# Patient Record
Sex: Male | Born: 1945 | Race: White | Hispanic: No | Marital: Married | Smoking: Never smoker
Health system: Southern US, Community
[De-identification: ages and names within clinical notes are randomized; demographics above are authoritative.]

## PROBLEM LIST (undated history)

## (undated) DIAGNOSIS — I2699 Other pulmonary embolism without acute cor pulmonale: Secondary | ICD-10-CM

## (undated) DIAGNOSIS — Z9989 Dependence on other enabling machines and devices: Secondary | ICD-10-CM

## (undated) DIAGNOSIS — Z86718 Personal history of other venous thrombosis and embolism: Secondary | ICD-10-CM

---

## 2019-10-11 ENCOUNTER — Other Ambulatory Visit: Payer: Self-pay

## 2019-10-11 ENCOUNTER — Emergency Department (HOSPITAL_BASED_OUTPATIENT_CLINIC_OR_DEPARTMENT_OTHER): Payer: Medicare Other

## 2019-10-11 ENCOUNTER — Inpatient Hospital Stay (HOSPITAL_BASED_OUTPATIENT_CLINIC_OR_DEPARTMENT_OTHER)
Admission: EM | Admit: 2019-10-11 | Discharge: 2019-10-18 | DRG: 177 | Disposition: A | Discharge status: Home Health | Payer: Medicare Other | Attending: Internal Medicine | Admitting: Internal Medicine

## 2019-10-11 ENCOUNTER — Encounter (HOSPITAL_BASED_OUTPATIENT_CLINIC_OR_DEPARTMENT_OTHER): Payer: Self-pay

## 2019-10-11 DIAGNOSIS — Z86711 Personal history of pulmonary embolism: Secondary | ICD-10-CM | POA: Diagnosis not present

## 2019-10-11 DIAGNOSIS — Z66 Do not resuscitate: Secondary | ICD-10-CM | POA: Diagnosis present

## 2019-10-11 DIAGNOSIS — Z7952 Long term (current) use of systemic steroids: Secondary | ICD-10-CM

## 2019-10-11 DIAGNOSIS — Z888 Allergy status to other drugs, medicaments and biological substances status: Secondary | ICD-10-CM

## 2019-10-11 DIAGNOSIS — E785 Hyperlipidemia, unspecified: Secondary | ICD-10-CM | POA: Diagnosis present

## 2019-10-11 DIAGNOSIS — Z86718 Personal history of other venous thrombosis and embolism: Secondary | ICD-10-CM

## 2019-10-11 DIAGNOSIS — Z7982 Long term (current) use of aspirin: Secondary | ICD-10-CM

## 2019-10-11 DIAGNOSIS — Z7901 Long term (current) use of anticoagulants: Secondary | ICD-10-CM

## 2019-10-11 DIAGNOSIS — R0902 Hypoxemia: Secondary | ICD-10-CM

## 2019-10-11 DIAGNOSIS — J1282 Pneumonia due to coronavirus disease 2019: Secondary | ICD-10-CM | POA: Diagnosis present

## 2019-10-11 DIAGNOSIS — U071 COVID-19: Secondary | ICD-10-CM | POA: Diagnosis present

## 2019-10-11 DIAGNOSIS — Z79899 Other long term (current) drug therapy: Secondary | ICD-10-CM | POA: Diagnosis not present

## 2019-10-11 DIAGNOSIS — J9601 Acute respiratory failure with hypoxia: Secondary | ICD-10-CM | POA: Diagnosis present

## 2019-10-11 HISTORY — DX: Other pulmonary embolism without acute cor pulmonale: I26.99

## 2019-10-11 HISTORY — DX: Personal history of other venous thrombosis and embolism: Z86.718

## 2019-10-11 HISTORY — DX: Dependence on other enabling machines and devices: Z99.89

## 2019-10-11 LAB — RESPIRATORY PANEL BY RT PCR (FLU A&B, COVID)
Influenza A by PCR: NEGATIVE
Influenza B by PCR: NEGATIVE
SARS Coronavirus 2 by RT PCR: POSITIVE — AB

## 2019-10-11 LAB — CBC WITH DIFFERENTIAL/PLATELET
Abs Immature Granulocytes: 0.05 10*3/uL (ref 0.00–0.07)
Basophils Absolute: 0 10*3/uL (ref 0.0–0.1)
Basophils Relative: 0 %
Eosinophils Absolute: 0 10*3/uL (ref 0.0–0.5)
Eosinophils Relative: 0 %
HCT: 46.8 % (ref 39.0–52.0)
Hemoglobin: 15.9 g/dL (ref 13.0–17.0)
Immature Granulocytes: 1 %
Lymphocytes Relative: 9 %
Lymphs Abs: 0.8 10*3/uL (ref 0.7–4.0)
MCH: 30.7 pg (ref 26.0–34.0)
MCHC: 34 g/dL (ref 30.0–36.0)
MCV: 90.3 fL (ref 80.0–100.0)
Monocytes Absolute: 0.4 10*3/uL (ref 0.1–1.0)
Monocytes Relative: 5 %
Neutro Abs: 7.4 10*3/uL (ref 1.7–7.7)
Neutrophils Relative %: 85 %
Platelets: 399 10*3/uL (ref 150–400)
RBC: 5.18 MIL/uL (ref 4.22–5.81)
RDW: 13.2 % (ref 11.5–15.5)
WBC: 8.7 10*3/uL (ref 4.0–10.5)
nRBC: 0 % (ref 0.0–0.2)

## 2019-10-11 LAB — COMPREHENSIVE METABOLIC PANEL
ALT: 37 U/L (ref 0–44)
AST: 42 U/L — ABNORMAL HIGH (ref 15–41)
Albumin: 3.2 g/dL — ABNORMAL LOW (ref 3.5–5.0)
Alkaline Phosphatase: 63 U/L (ref 38–126)
Anion gap: 13 (ref 5–15)
BUN: 24 mg/dL — ABNORMAL HIGH (ref 8–23)
CO2: 24 mmol/L (ref 22–32)
Calcium: 8.9 mg/dL (ref 8.9–10.3)
Chloride: 102 mmol/L (ref 98–111)
Creatinine, Ser: 1.02 mg/dL (ref 0.61–1.24)
GFR calc Af Amer: >60 mL/min (ref >60)
GFR calc non Af Amer: >60 mL/min (ref >60)
Glucose, Bld: 140 mg/dL — ABNORMAL HIGH (ref 70–99)
Potassium: 3.9 mmol/L (ref 3.5–5.1)
Sodium: 139 mmol/L (ref 135–145)
Total Bilirubin: 0.9 mg/dL (ref 0.3–1.2)
Total Protein: 6.8 g/dL (ref 6.5–8.1)

## 2019-10-11 LAB — TROPONIN I (HIGH SENSITIVITY)
Troponin I (High Sensitivity): 9 ng/L (ref <18)
Troponin I (High Sensitivity): 7 ng/L (ref <18)

## 2019-10-11 LAB — SARS CORONAVIRUS 2 AG (30 MIN TAT): SARS Coronavirus 2 Ag: NEGATIVE

## 2019-10-11 LAB — D-DIMER, QUANTITATIVE: D-Dimer, Quant: 0.64 ug/mL-FEU — ABNORMAL HIGH (ref 0.00–0.50)

## 2019-10-11 LAB — LACTATE DEHYDROGENASE: LDH: 568 U/L — ABNORMAL HIGH (ref 98–192)

## 2019-10-11 LAB — LACTIC ACID, PLASMA
Lactic Acid, Venous: 3 mmol/L (ref 0.5–1.9)
Lactic Acid, Venous: 2.1 mmol/L (ref 0.5–1.9)

## 2019-10-11 LAB — PROCALCITONIN: Procalcitonin: <0.1 ng/mL

## 2019-10-11 LAB — FIBRINOGEN: Fibrinogen: >800 mg/dL — ABNORMAL HIGH (ref 210–475)

## 2019-10-11 LAB — C-REACTIVE PROTEIN: CRP: 16.5 mg/dL — ABNORMAL HIGH (ref <1.0)

## 2019-10-11 LAB — BRAIN NATRIURETIC PEPTIDE: B Natriuretic Peptide: 118.1 pg/mL — ABNORMAL HIGH (ref 0.0–100.0)

## 2019-10-11 LAB — FERRITIN: Ferritin: 765 ng/mL — ABNORMAL HIGH (ref 24–336)

## 2019-10-11 LAB — TRIGLYCERIDES: Triglycerides: 145 mg/dL (ref <150)

## 2019-10-11 MED ORDER — SODIUM CHLORIDE 0.9 % IV SOLN
100.0000 mg | Freq: Every day | INTRAVENOUS | Status: AC
  Administered 2019-10-12 – 2019-10-15 (×4): 100 mg via INTRAVENOUS
  Filled 2019-10-11 (×4): qty 20

## 2019-10-11 MED ORDER — SODIUM CHLORIDE 0.9 % IV SOLN
500.0000 mg | Freq: Once | INTRAVENOUS | Status: AC
  Administered 2019-10-11: 500 mg via INTRAVENOUS
  Filled 2019-10-11: qty 500

## 2019-10-11 MED ORDER — SODIUM CHLORIDE 0.9 % IV SOLN
INTRAVENOUS | Status: DC | PRN
  Administered 2019-10-11: 250 mL via INTRAVENOUS

## 2019-10-11 MED ORDER — DEXAMETHASONE SODIUM PHOSPHATE 10 MG/ML IJ SOLN
10.0000 mg | Freq: Once | INTRAMUSCULAR | Status: AC
  Administered 2019-10-11: 10 mg via INTRAVENOUS
  Filled 2019-10-11: qty 1

## 2019-10-11 MED ORDER — ALBUTEROL SULFATE HFA 108 (90 BASE) MCG/ACT IN AERS
4.0000 | INHALATION_SPRAY | RESPIRATORY_TRACT | Status: AC
  Administered 2019-10-11: 4 via RESPIRATORY_TRACT
  Filled 2019-10-11: qty 6.7

## 2019-10-11 MED ORDER — SODIUM CHLORIDE 0.9 % IV SOLN
100.0000 mg | INTRAVENOUS | Status: AC
  Administered 2019-10-11 (×2): 100 mg via INTRAVENOUS
  Filled 2019-10-11 (×2): qty 20

## 2019-10-11 MED ORDER — SODIUM CHLORIDE 0.9 % IV SOLN
1.0000 g | Freq: Once | INTRAVENOUS | Status: AC
  Administered 2019-10-11: 1 g via INTRAVENOUS
  Filled 2019-10-11: qty 10

## 2019-10-11 MED ORDER — SODIUM CHLORIDE 0.9 % IV BOLUS
250.0000 mL | Freq: Once | INTRAVENOUS | Status: AC
  Administered 2019-10-11: 250 mL via INTRAVENOUS

## 2019-10-11 NOTE — ED Triage Notes (Signed)
Pt arrives to ED POV with c/o SOB starting today. Pt states that he was diagnosed with bronchitis last Monday.

## 2019-10-11 NOTE — ED Provider Notes (Signed)
[facility] EMERGENCY DEPARTMENT Provider Note   CSN: [id number] Arrival date & time: 10/11/19  1732     History Chief Complaint  Patient presents with  . Shortness of Breath    Bruce Stanley is a 74 y.o. male.  HPI Patient reports he was diagnosed with sinusitis 1/5.  He was treated with Flonase, doxycycline and Tessalon.  Earlier in the week he had developed a brief period of fever up to 101 but he reports that it resolved by the time he rechecked it in the evening.  He was having some coughing which he qualifies as better now.  Patient was seen at urgent care on 1\11 and had negative rapid Covid and a chest x-ray that did not show acute findings.  He was placed on oral prednisone and cough syrup with prescription for ProAir .  He reports he became much more short of breath today.  He reports he has had fatigue.  He denies any chest pain.  He denies swelling of his legs or calves.  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  Patient denies any history of COPD.  He reports he is supposed to wear nighttime CPAP but does not use oxygen chronically.  Denies any history of CHF.    Past Medical History:  Diagnosis Date  . CPAP (continuous positive airway pressure) dependence   . H/O blood clots   . PE (pulmonary thromboembolism) [facility])     Patient Active Problem List   Diagnosis Date Noted  . Pneumonia due to COVID-19 virus 10/11/2019         No family history on file.  Social History   Tobacco Use  . Smoking status: Never Smoker  . Smokeless tobacco: Never Used  Substance Use Topics  . Alcohol use: Never  . Drug use: Never    Home Medications Prior to Admission medications   Medication Sig Start Date End Date Taking? Authorizing Provider  albuterol (VENTOLIN HFA) 108 (90 Base) MCG/ACT inhaler Inhale into the lungs. 10/07/19 11/06/19 Yes [provider]  aspirin 81 MG EC tablet Take by mouth. 11/07/13  Yes  [provider]  atorvastatin (LIPITOR) 40 MG tablet TAKE 1 TABLET(40 MG) BY MOUTH DAILY 01/16/17  Yes [provider]  doxycycline (VIBRA-TABS) 100 MG tablet Take by mouth. 10/04/19 10/14/19 Yes [provider]  fluticasone (FLONASE) 50 MCG/ACT nasal spray 2 sprays by Each Nare route daily. 10/01/19  Yes [provider]  guaiFENesin-codeine (ROBITUSSIN AC) 100-10 MG/5ML syrup Take by mouth. 10/07/19 10/17/19 Yes [provider]  Omega-3 Fatty Acids (FISH OIL) 1000 MG CAPS TAKE 1 CAPSULE BY MOUTH DAILY 08/16/16  Yes [provider]  predniSONE (DELTASONE) 5 MG tablet Take by mouth. 10/07/19 10/13/19 Yes [provider]  warfarin (COUMADIN) 5 MG tablet Take by mouth. 01/17/17 12/09/19 Yes [provider]  benzonatate (TESSALON) 200 MG capsule Take by mouth. 10/04/19 10/11/19  [provider]    Allergies    Iodine  Review of Systems   Review of Systems 10 Systems reviewed and are negative for acute change except as noted in the HPI.  Physical Exam Updated Vital Signs BP (!) 147/99   Pulse (!) 115   Temp 98.3 F (36.8 C) (Oral)   Resp (!) 22   Ht 6' (1.829 m)   Wt 117.9 kg   SpO2 91%   BMI 35.26 kg/m   Physical Exam Constitutional:  Comments: Patient is alert.  He is moderately dyspneic with tachypnea.  Mental status is clear.  HENT:     Head: Normocephalic and atraumatic.  Eyes:     Extraocular Movements: Extraocular movements intact.  Cardiovascular:     Comments: Tachycardia no gross rub murmur gallop. Pulmonary:     Comments: Moderate increased work of breathing at rest.  Crackles bilateral lower lung fields. Abdominal:     General: There is no distension.     Palpations: Abdomen is soft.     Tenderness: There is no abdominal tenderness. There is no guarding.  Musculoskeletal:        General: No swelling or tenderness. Normal range of motion.     Cervical back: Neck supple.     Right lower leg: No  edema.     Left lower leg: No edema.  Skin:    General: Skin is warm and dry.  Neurological:     General: No focal deficit present.     Mental Status: He is oriented to person, place, and time.     Coordination: Coordination normal.  Psychiatric:        Mood and Affect: Mood normal.     ED Results / Procedures / Treatments   Labs (all labs ordered are listed, but only abnormal results are displayed) Labs Reviewed  RESPIRATORY PANEL BY RT PCR (FLU A&B, COVID) - Abnormal; Notable for the following components:      Result Value   SARS Coronavirus 2 by RT PCR POSITIVE (*)    All other components within normal limits  LACTIC ACID, PLASMA - Abnormal; Notable for the following components:   Lactic Acid, Venous 3.0 (*)    All other components within normal limits  LACTIC ACID, PLASMA - Abnormal; Notable for the following components:   Lactic Acid, Venous 2.1 (*)    All other components within normal limits  COMPREHENSIVE METABOLIC PANEL - Abnormal; Notable for the following components:   Glucose, Bld 140 (*)    BUN 24 (*)    Albumin 3.2 (*)    AST 42 (*)    All other components within normal limits  D-DIMER, QUANTITATIVE (NOT AT [facility]) - Abnormal; Notable for the following components:   D-Dimer, Quant 0.64 (*)    All other components within normal limits  LACTATE DEHYDROGENASE - Abnormal; Notable for the following components:   LDH 568 (*)    All other components within normal limits  FERRITIN - Abnormal; Notable for the following components:   Ferritin 765 (*)    All other components within normal limits  FIBRINOGEN - Abnormal; Notable for the following components:   Fibrinogen >800 (*)    All other components within normal limits  C-REACTIVE PROTEIN - Abnormal; Notable for the following components:   CRP 16.5 (*)    All other components within normal limits  BRAIN NATRIURETIC PEPTIDE - Abnormal; Notable for the following components:   B Natriuretic Peptide 118.1 (*)    All  other components within normal limits  SARS CORONAVIRUS 2 AG (30 MIN TAT)  CULTURE, BLOOD (ROUTINE X 2)  CULTURE, BLOOD (ROUTINE X 2)  SARS CORONAVIRUS 2 BY RT PCR (HOSPITAL ORDER, PERFORMED IN [facility])  CBC WITH DIFFERENTIAL/PLATELET  PROCALCITONIN  TRIGLYCERIDES  TROPONIN I (HIGH SENSITIVITY)  TROPONIN I (HIGH SENSITIVITY)    EKG EKG Interpretation  Date/Time:  Friday October 11 2019 17:39:52 EST Ventricular Rate:  117 PR Interval:    QRS Duration: 98 QT  Interval:  329 QTC Calculation: 459 R Axis:   -158 Text Interpretation: Sinus tachycardia Atrial premature complexes Low voltage, precordial leads Consider right ventricular hypertrophy Baseline wander in lead(s) V3 no acute ischemic changes Confirmed by Charlesetta Shanks [phone]) on 10/11/2019 6:02:28 PM   Radiology DG Chest Port 1 View  Result Date: 10/11/2019 CLINICAL DATA:  Shortness of breath EXAM: PORTABLE CHEST 1 VIEW COMPARISON:  10/07/2019 FINDINGS: Interim development of bilateral left greater than right interstitial and ground-glass opacity, largely peripheral on the left. No pleural effusion. Mild cardiomegaly. No pneumothorax. IMPRESSION: Interim development of left greater than right interstitial and ground-glass opacity, suspicious for bilateral pneumonia, possible atypical or viral pneumonia. Electronically Signed   By: Donavan Foil M.D.   On: 10/11/2019 18:22    Procedures Procedures (including critical care time) CRITICAL CARE Performed by: Charlesetta Shanks   Total critical care time: 45 minutes  Critical care time was exclusive of separately billable procedures and treating other patients.  Critical care was necessary to treat or prevent imminent or life-threatening deterioration.  Critical care was time spent personally by me on the following activities: development of treatment plan with patient and/or surrogate as well as nursing, discussions with consultants, evaluation of patient's  response to treatment, examination of patient, obtaining history from patient or surrogate, ordering and performing treatments and interventions, ordering and review of laboratory studies, ordering and review of radiographic studies, pulse oximetry and re-evaluation of patient's condition. Medications Ordered in ED Medications  albuterol (VENTOLIN HFA) 108 (90 Base) MCG/ACT inhaler 4 puff (4 puffs Inhalation Given by Other 10/11/19 1918)  0.9 %  sodium chloride infusion (250 mLs Intravenous New Bag/Given 10/11/19 1925)  0.9 %  sodium chloride infusion ( Intravenous Stopped 10/11/19 2004)  remdesivir 100 mg in sodium chloride 0.9 % 100 mL IVPB (100 mg Intravenous New Bag/Given 10/11/19 2233)    Followed by  remdesivir 100 mg in sodium chloride 0.9 % 100 mL IVPB (has no administration in time range)  cefTRIAXone (ROCEPHIN) 1 g in sodium chloride 0.9 % 100 mL IVPB (0 g Intravenous Stopped 10/11/19 2004)  azithromycin (ZITHROMAX) 500 mg in sodium chloride 0.9 % 250 mL IVPB (0 mg Intravenous Stopped 10/11/19 2100)  sodium chloride 0.9 % bolus 250 mL (0 mLs Intravenous Stopped 10/11/19 1930)  dexamethasone (DECADRON) injection 10 mg (10 mg Intravenous Given 10/11/19 2225)    ED Course  I have reviewed the triage vital signs and the nursing notes.  Pertinent labs & imaging results that were available during my care of the patient were reviewed by me and considered in my medical decision making (see chart for details).    MDM Rules/Calculators/A&P                     Consult: Reviewed Dr. Andria Frames at [facility] for admission.  Patient presents as outlined above.  Presentation is consistent with Covid pneumonia.  This is confirmed on PCR testing and chest x-ray.  Patient arrived hypoxic in the mid 59s.  On 6 to 8 L supplemental oxygen patient is maintaining saturations in the low 90s.  He is much more comfortable at this point.  His heart rate has improved somewhat and still in the low 100s.   Patient did have elevated lactic acid and this chest x-ray that had some suggestion of consolidation, prior to receiving Covid results I did empirically initiate a dose of Rocephin and Zithromax.  Patient is also been given albuterol inhaler and Decadron.  His mental status is clear.  Patient is tachypneic but reports feeling improved and has now lately been requesting something to eat.  We will continue to monitor closely with plan to transfer to [facility] for admission for COVID-19 pneumonia with hypoxia. Final Clinical Impression(s) / ED Diagnoses Final diagnoses:  Pneumonia due to COVID-19 virus  Hypoxia    Rx / DC Orders ED Discharge Orders    None       Arby Barrette, MD 10/11/19 2359

## 2019-10-11 NOTE — ED Notes (Signed)
  Turned patients O2 down from 7L to 6L.  Shortly after, the patient had a coughing episode and SPO2 dropped down to 85-86%.  After coughing episode patients SPO2 was not improving.  Patient was increased to 10L and instructed to take deep breaths through his nose and out his mouth.  Patient slowly came back to 92% and O2 was turned back down to 7L.  Patient maintaining SPO2 of 90-92%.  RT Notified

## 2019-10-11 NOTE — ED Notes (Signed)
Spoke with Roe Coombs (friend) states he will call wife and inform her of pts condition, message left for daughter daughter returned call informed of pts condition. Pt aware

## 2019-10-11 NOTE — ED Notes (Signed)
Pt daughter number added to chart.

## 2019-10-11 NOTE — ED Notes (Signed)
  Patient was given water and graham crackers.

## 2019-10-12 ENCOUNTER — Encounter (HOSPITAL_COMMUNITY): Payer: Self-pay

## 2019-10-12 DIAGNOSIS — R0902 Hypoxemia: Secondary | ICD-10-CM

## 2019-10-12 DIAGNOSIS — J1282 Pneumonia due to coronavirus disease 2019: Secondary | ICD-10-CM

## 2019-10-12 DIAGNOSIS — Z79899 Other long term (current) drug therapy: Secondary | ICD-10-CM

## 2019-10-12 DIAGNOSIS — E785 Hyperlipidemia, unspecified: Secondary | ICD-10-CM

## 2019-10-12 DIAGNOSIS — Z86718 Personal history of other venous thrombosis and embolism: Secondary | ICD-10-CM

## 2019-10-12 DIAGNOSIS — J9601 Acute respiratory failure with hypoxia: Secondary | ICD-10-CM

## 2019-10-12 DIAGNOSIS — Z7901 Long term (current) use of anticoagulants: Secondary | ICD-10-CM

## 2019-10-12 DIAGNOSIS — Z86711 Personal history of pulmonary embolism: Secondary | ICD-10-CM

## 2019-10-12 DIAGNOSIS — U071 COVID-19: Principal | ICD-10-CM

## 2019-10-12 DIAGNOSIS — G4733 Obstructive sleep apnea (adult) (pediatric): Secondary | ICD-10-CM

## 2019-10-12 DIAGNOSIS — Z66 Do not resuscitate: Secondary | ICD-10-CM

## 2019-10-12 LAB — CBC WITH DIFFERENTIAL/PLATELET
Abs Immature Granulocytes: 0.03 10*3/uL (ref 0.00–0.07)
Basophils Absolute: 0 10*3/uL (ref 0.0–0.1)
Basophils Relative: 0 %
Eosinophils Absolute: 0 10*3/uL (ref 0.0–0.5)
Eosinophils Relative: 0 %
HCT: 45.7 % (ref 39.0–52.0)
Hemoglobin: 15.3 g/dL (ref 13.0–17.0)
Immature Granulocytes: 1 %
Lymphocytes Relative: 9 %
Lymphs Abs: 0.5 10*3/uL — ABNORMAL LOW (ref 0.7–4.0)
MCH: 30.1 pg (ref 26.0–34.0)
MCHC: 33.5 g/dL (ref 30.0–36.0)
MCV: 90 fL (ref 80.0–100.0)
Monocytes Absolute: 0.1 10*3/uL (ref 0.1–1.0)
Monocytes Relative: 2 %
Neutro Abs: 5.3 10*3/uL (ref 1.7–7.7)
Neutrophils Relative %: 88 %
Platelets: 376 10*3/uL (ref 150–400)
RBC: 5.08 MIL/uL (ref 4.22–5.81)
RDW: 13.3 % (ref 11.5–15.5)
WBC: 6 10*3/uL (ref 4.0–10.5)
nRBC: 0 % (ref 0.0–0.2)

## 2019-10-12 LAB — COMPREHENSIVE METABOLIC PANEL
ALT: 39 U/L (ref 0–44)
AST: 40 U/L (ref 15–41)
Albumin: 3.2 g/dL — ABNORMAL LOW (ref 3.5–5.0)
Alkaline Phosphatase: 63 U/L (ref 38–126)
Anion gap: 13 (ref 5–15)
BUN: 27 mg/dL — ABNORMAL HIGH (ref 8–23)
CO2: 25 mmol/L (ref 22–32)
Calcium: 9.1 mg/dL (ref 8.9–10.3)
Chloride: 103 mmol/L (ref 98–111)
Creatinine, Ser: 1 mg/dL (ref 0.61–1.24)
GFR calc Af Amer: >60 mL/min (ref >60)
GFR calc non Af Amer: >60 mL/min (ref >60)
Glucose, Bld: 147 mg/dL — ABNORMAL HIGH (ref 70–99)
Potassium: 4 mmol/L (ref 3.5–5.1)
Sodium: 141 mmol/L (ref 135–145)
Total Bilirubin: 0.6 mg/dL (ref 0.3–1.2)
Total Protein: 7.6 g/dL (ref 6.5–8.1)

## 2019-10-12 LAB — GLUCOSE, CAPILLARY
Glucose-Capillary: 146 mg/dL — ABNORMAL HIGH (ref 70–99)
Glucose-Capillary: 186 mg/dL — ABNORMAL HIGH (ref 70–99)
Glucose-Capillary: 135 mg/dL — ABNORMAL HIGH (ref 70–99)
Glucose-Capillary: 141 mg/dL — ABNORMAL HIGH (ref 70–99)

## 2019-10-12 LAB — HEMOGLOBIN A1C
Hgb A1c MFr Bld: 5.7 % — ABNORMAL HIGH (ref 4.8–5.6)
Mean Plasma Glucose: 116.89 mg/dL

## 2019-10-12 LAB — C-REACTIVE PROTEIN: CRP: 14.8 mg/dL — ABNORMAL HIGH (ref <1.0)

## 2019-10-12 LAB — D-DIMER, QUANTITATIVE: D-Dimer, Quant: 0.94 ug/mL-FEU — ABNORMAL HIGH (ref 0.00–0.50)

## 2019-10-12 LAB — PROTIME-INR
INR: 4.2 (ref 0.8–1.2)
Prothrombin Time: 40.6 seconds — ABNORMAL HIGH (ref 11.4–15.2)

## 2019-10-12 LAB — SARS CORONAVIRUS 2 BY RT PCR (HOSPITAL ORDER, PERFORMED IN ~~LOC~~ HOSPITAL LAB): SARS Coronavirus 2: POSITIVE — AB

## 2019-10-12 LAB — ABO/RH: ABO/RH(D): O POS

## 2019-10-12 LAB — FERRITIN: Ferritin: 898 ng/mL — ABNORMAL HIGH (ref 24–336)

## 2019-10-12 MED ORDER — DEXAMETHASONE SODIUM PHOSPHATE 10 MG/ML IJ SOLN: 6.0000 mg | INTRAMUSCULAR | Status: DC

## 2019-10-12 MED ORDER — SODIUM CHLORIDE 0.9 % IV SOLN: 250.0000 mL | INTRAVENOUS | Status: DC | PRN

## 2019-10-12 MED ORDER — BENZONATATE 100 MG PO CAPS: 100.0000 mg | ORAL_CAPSULE | ORAL | Status: DC | PRN

## 2019-10-12 MED ORDER — GUAIFENESIN-DM 100-10 MG/5ML PO SYRP
10.0000 mL | ORAL_SOLUTION | ORAL | Status: DC | PRN
  Administered 2019-10-12 – 2019-10-17 (×2): 10 mL via ORAL
  Filled 2019-10-12 (×2): qty 10

## 2019-10-12 MED ORDER — ENOXAPARIN SODIUM 40 MG/0.4ML ~~LOC~~ SOLN: 40.0000 mg | SUBCUTANEOUS | Status: DC

## 2019-10-12 MED ORDER — OMEGA-3-ACID ETHYL ESTERS 1 G PO CAPS
1.0000 g | ORAL_CAPSULE | Freq: Every day | ORAL | Status: DC
  Administered 2019-10-12 – 2019-10-18 (×7): 1 g via ORAL
  Filled 2019-10-12 (×8): qty 1

## 2019-10-12 MED ORDER — TOCILIZUMAB 400 MG/20ML IV SOLN
800.0000 mg | Freq: Once | INTRAVENOUS | Status: AC
  Administered 2019-10-12: 800 mg via INTRAVENOUS
  Filled 2019-10-12: qty 40

## 2019-10-12 MED ORDER — IPRATROPIUM-ALBUTEROL 20-100 MCG/ACT IN AERS
1.0000 | INHALATION_SPRAY | Freq: Four times a day (QID) | RESPIRATORY_TRACT | Status: DC
  Administered 2019-10-12 – 2019-10-18 (×25): 1 via RESPIRATORY_TRACT
  Filled 2019-10-12: qty 4

## 2019-10-12 MED ORDER — INSULIN ASPART 100 UNIT/ML ~~LOC~~ SOLN
0.0000 [IU] | Freq: Three times a day (TID) | SUBCUTANEOUS | Status: DC
  Administered 2019-10-12: 2 [IU] via SUBCUTANEOUS
  Administered 2019-10-12 – 2019-10-13 (×3): 1 [IU] via SUBCUTANEOUS
  Administered 2019-10-13: 2 [IU] via SUBCUTANEOUS
  Administered 2019-10-14: 3 [IU] via SUBCUTANEOUS
  Administered 2019-10-14: 1 [IU] via SUBCUTANEOUS
  Administered 2019-10-15: 2 [IU] via SUBCUTANEOUS
  Administered 2019-10-15: 1 [IU] via SUBCUTANEOUS
  Administered 2019-10-15: 3 [IU] via SUBCUTANEOUS
  Administered 2019-10-16 (×2): 2 [IU] via SUBCUTANEOUS
  Administered 2019-10-18 (×3): 1 [IU] via SUBCUTANEOUS

## 2019-10-12 MED ORDER — ACETAMINOPHEN 325 MG PO TABS: 650.0000 mg | ORAL_TABLET | Freq: Four times a day (QID) | ORAL | Status: DC | PRN

## 2019-10-12 MED ORDER — ASCORBIC ACID 500 MG PO TABS
500.0000 mg | ORAL_TABLET | Freq: Every day | ORAL | Status: DC
  Administered 2019-10-12 – 2019-10-18 (×7): 500 mg via ORAL
  Filled 2019-10-12 (×7): qty 1

## 2019-10-12 MED ORDER — FAMOTIDINE 20 MG PO TABS
20.0000 mg | ORAL_TABLET | Freq: Every day | ORAL | Status: DC
  Administered 2019-10-12 – 2019-10-18 (×7): 20 mg via ORAL
  Filled 2019-10-12 (×7): qty 1

## 2019-10-12 MED ORDER — SODIUM CHLORIDE 0.9% FLUSH: 3.0000 mL | INTRAVENOUS | Status: DC | PRN

## 2019-10-12 MED ORDER — BENZONATATE 100 MG PO CAPS: 100.0000 mg | ORAL_CAPSULE | Freq: Three times a day (TID) | ORAL | Status: DC | PRN

## 2019-10-12 MED ORDER — SODIUM CHLORIDE 0.9% FLUSH: 3.0000 mL | Freq: Two times a day (BID) | INTRAVENOUS | Status: DC

## 2019-10-12 MED ORDER — GUAIFENESIN-CODEINE 100-10 MG/5ML PO SOLN: 5.0000 mL | Freq: Four times a day (QID) | ORAL | Status: DC | PRN

## 2019-10-12 MED ORDER — ENOXAPARIN SODIUM 60 MG/0.6ML ~~LOC~~ SOLN: 60.0000 mg | SUBCUTANEOUS | Status: DC

## 2019-10-12 MED ORDER — ALBUTEROL SULFATE HFA 108 (90 BASE) MCG/ACT IN AERS
1.0000 | INHALATION_SPRAY | Freq: Four times a day (QID) | RESPIRATORY_TRACT | Status: DC | PRN
  Administered 2019-10-12: 2 via RESPIRATORY_TRACT
  Filled 2019-10-12: qty 6.7

## 2019-10-12 MED ORDER — PREDNISONE 10 MG PO TABS: 5.0000 mg | ORAL_TABLET | Freq: Every day | ORAL | Status: DC

## 2019-10-12 MED ORDER — FLUTICASONE PROPIONATE 50 MCG/ACT NA SUSP
2.0000 | Freq: Every day | NASAL | Status: DC
  Administered 2019-10-12 – 2019-10-18 (×7): 2 via NASAL
  Filled 2019-10-12: qty 16

## 2019-10-12 MED ORDER — HYDROCOD POLST-CPM POLST ER 10-8 MG/5ML PO SUER
5.0000 mL | Freq: Two times a day (BID) | ORAL | Status: DC | PRN
  Administered 2019-10-14 – 2019-10-18 (×3): 5 mL via ORAL
  Filled 2019-10-12 (×3): qty 5

## 2019-10-12 MED ORDER — ZINC SULFATE 220 (50 ZN) MG PO CAPS
220.0000 mg | ORAL_CAPSULE | Freq: Every day | ORAL | Status: DC
  Administered 2019-10-12 – 2019-10-18 (×7): 220 mg via ORAL
  Filled 2019-10-12 (×7): qty 1

## 2019-10-12 MED ORDER — DEXAMETHASONE SODIUM PHOSPHATE 10 MG/ML IJ SOLN
10.0000 mg | INTRAMUSCULAR | Status: DC
  Administered 2019-10-12 – 2019-10-17 (×6): 10 mg via INTRAVENOUS
  Filled 2019-10-12 (×5): qty 1

## 2019-10-12 MED ORDER — ATORVASTATIN CALCIUM 40 MG PO TABS
40.0000 mg | ORAL_TABLET | Freq: Every day | ORAL | Status: DC
  Administered 2019-10-12 – 2019-10-18 (×7): 40 mg via ORAL
  Filled 2019-10-12 (×7): qty 1

## 2019-10-12 MED ORDER — ASPIRIN 81 MG PO TBEC: 81.0000 mg | DELAYED_RELEASE_TABLET | Freq: Every day | ORAL | Status: DC

## 2019-10-12 MED ORDER — WARFARIN - PHARMACIST DOSING INPATIENT: Freq: Every day | Status: DC

## 2019-10-12 NOTE — Plan of Care (Signed)
  Problem: Education: Goal: Knowledge of risk factors and measures for prevention of condition will improve Outcome: Progressing   Problem: Coping: Goal: Psychosocial and spiritual needs will be supported Outcome: Progressing   Problem: Respiratory: Goal: Will maintain a patent airway Outcome: Progressing Goal: Complications related to the disease process, condition or treatment will be avoided or minimized Outcome: Progressing   

## 2019-10-12 NOTE — Progress Notes (Signed)
PROGRESS NOTE                                                                                                                                                                                                             Bruce Stanley Demographics:    Bruce Stanley, is a 74 y.o. male, DOB - June 21, 1946, WUJ:[id number]  Outpatient Primary MD for the Bruce Stanley is Bruce Stanley, No Pcp Per   Admit date - 10/11/2019   LOS - 1  Chief Complaint  Bruce Stanley presents with  . Shortness of Breath       Brief Narrative: Bruce Stanley is a 74 y.o. 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  See below for further details.   Subjective:    [name] today claims to feel slightly better-but still on 7 L of oxygen.  He does not appear to be any distress.  Denies any nausea vomiting or diarrhea.   Assessment  & Plan :   Acute Hypoxic Resp Failure due to Covid 19 Viral pneumonia: Seems to be essentially the same-still requiring 7 L of oxygen.  Continue steroids  and remdesivir.  Extensive discussion with Bruce Stanley regarding off label use of Actemra-rationale, risks, benefits discussed-he consents.  He has no history of TB, hep B, diverticulitis.  The rationale for the off label use of Actemra its known side effects, potential benefits was  discussed with Bruce Stanley .The use of Actemra is based on published clinical articles/anecdotal data as several randomized trials have been negative, but lately there have been a few positive randomized trials as well.  There is no history of tuberculosis, hepatitis B or C.  Complete risks and long-term side effects are unknown, however in the best clinical judgment it is felt that the clinical benefit at this time  outweighs medical risks given tenuous clinical state of the Bruce Stanley.  Bruce Stanley agrees with the treatment plan and consent to the use of Actemra.    Fever: afebrile  O2 requirements:  SpO2: 97 % O2 Flow Rate (L/min): 7 L/min   COVID-19 Labs: Recent Labs    10/11/19 1745  DDIMER 0.64*  FERRITIN 765*  LDH 568*  CRP 16.5*  Component Value Date/Time   BNP 118.1 (H) 10/11/2019 1745    Recent Labs  Lab 10/11/19 1745  PROCALCITON <0.10    Lab Results  Component Value Date   SARSCOV2NAA POSITIVE (A) 10/11/2019     COVID-19 Medications: Steroids: 1/15>> Remdesivir: 1/15>>  Other medications: Diuretics:Euvolemic-no need for lasix Antibiotics:Not needed as no evidence of bacterial infection Insulin: Monitor CBGs closely on SSI while on steroids.  Check A1c.   Prone/Incentive Spirometry: encouraged Bruce Stanley to lie prone for 3-4 hours at a time for a total of 16 hours a day, and to encourage incentive spirometry use 3-4/hour.  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 Yes; Fluid consistency: Thin  Diet effective now               Disposition Plan  :  Remain hospitalized  Barriers to discharge: Hypoxia requiring O2 supplementation/complete 5 days of IV Remdesivir  Antimicorbials  :    Anti-infectives (From admission, onward)   Start     Dose/Rate Route Frequency Ordered Stop   10/12/19 1000  remdesivir 100 mg in sodium chloride 0.9 % 100 mL IVPB     100 mg 200 mL/hr over 30  Minutes Intravenous Daily 10/11/19 2209 10/16/19 0959   10/11/19 2215  remdesivir 100 mg in sodium chloride 0.9 % 100 mL IVPB     100 mg 200 mL/hr over 30 Minutes Intravenous Every 30 min 10/11/19 2209 10/12/19 0010   10/11/19 1900  cefTRIAXone (ROCEPHIN) 1 g in sodium chloride 0.9 % 100 mL IVPB     1 g 200 mL/hr over 30 Minutes Intravenous  Once 10/11/19 1857 10/11/19 2004   10/11/19 1900  azithromycin (ZITHROMAX) 500 mg in sodium chloride 0.9 % 250 mL IVPB     500 mg 250 mL/hr over 60 Minutes Intravenous  Once 10/11/19 1857 10/11/19 2100      Inpatient Medications  Scheduled Meds: . vitamin C  500 mg Oral Daily  . atorvastatin  40 mg Oral q1800  . dexamethasone (DECADRON) injection  6 mg Intravenous Q24H  . fluticasone  2 spray Each Nare Daily  . Ipratropium-Albuterol  1 puff Inhalation Q6H  . omega-3 acid ethyl esters  1 g Oral Daily  . Warfarin - Pharmacist Dosing Inpatient   Does not apply q1800  . zinc sulfate  220 mg Oral Daily   Continuous Infusions: . sodium chloride Stopped (10/11/19 2004)  . remdesivir 100 mg in NS 100 mL     PRN Meds:.sodium chloride, acetaminophen, albuterol, benzonatate, chlorpheniramine-HYDROcodone, guaiFENesin-dextromethorphan   Time Spent in minutes 35  See all Orders from today for further details   Oren Binet M.D on 10/12/2019 at 7:42 AM  To page go to [email or web address] - use universal password  [facility] -  Office  [phone]    Objective:   Vitals:   10/12/19 0000 10/12/19 0030 10/12/19 0158 10/12/19 0522  BP: (!) 152/95 (!) 158/95 (!) 151/91 116/66  Pulse: 92 89 92 78  Resp: (!) 24 (!) 27 (!) 27 (!) 21  Temp:      TempSrc:      SpO2: 92% 95% 91% 97%  Weight:      Height:        Wt Readings from Last 3 Encounters:  10/11/19 117.9 kg     Intake/Output Summary (Last 24 hours) at 10/12/2019 0742 Last data filed at 10/12/2019 0200 Gross per 24 hour  Intake 0 ml  Output 100 ml  Net -100 ml     Physical  Exam Gen Exam:Alert awake-not in any distress HEENT:atraumatic, normocephalic Chest: B/L clear to auscultation anteriorly CVS:S1S2 regular Abdomen:soft non tender, non distended Extremities:no edema Neurology: Non focal Skin: no rash   Data Review:    CBC Recent Labs  Lab 10/11/19 1745  WBC 8.7  HGB 15.9  HCT 46.8  PLT 399  MCV 90.3  MCH 30.7  MCHC 34.0  RDW 13.2  LYMPHSABS 0.8  MONOABS 0.4  EOSABS 0.0  BASOSABS 0.0    Chemistries  Recent Labs  Lab 10/11/19 1745  NA 139  K 3.9  CL 102  CO2 24  GLUCOSE 140*  BUN 24*  CREATININE 1.02  CALCIUM 8.9  AST 42*  ALT 37  ALKPHOS 63  BILITOT 0.9   ------------------------------------------------------------------------------------------------------------------ Recent Labs    10/11/19 1745  TRIG 145    No results found for: HGBA1C ------------------------------------------------------------------------------------------------------------------ No results for input(s): TSH, T4TOTAL, T3FREE, THYROIDAB in the last 72 hours.  Invalid input(s): FREET3 ------------------------------------------------------------------------------------------------------------------ Recent Labs    10/11/19 1745  FERRITIN 765*    Coagulation profile Recent Labs  Lab 10/12/19 0423  INR 4.2*    Recent Labs    10/11/19 1745  DDIMER 0.64*    Cardiac Enzymes No results for input(s): CKMB, TROPONINI, MYOGLOBIN in the last 168 hours.  Invalid input(s): CK ------------------------------------------------------------------------------------------------------------------    Component Value Date/Time   BNP 118.1 (H) 10/11/2019 1745    Micro Results Recent Results (from the past 240 hour(s))  Blood Culture (routine x 2)     Status: None (Preliminary result)   Collection Time: 10/11/19  5:45 PM   Specimen: BLOOD RIGHT ARM  Result Value Ref Range Status   Specimen Description   Final    BLOOD RIGHT ARM Performed at [facility], [address] [place], [place] [address]    Special Requests   Final    BOTTLES DRAWN AEROBIC AND ANAEROBIC Blood Culture adequate volume Performed at [facility], [address] [place], [place] [address]    Culture   Final    NO GROWTH < 12 HOURS Performed at [facility], [place] [address], [place], [place] [address]    Report Status PENDING  Incomplete  SARS Coronavirus 2 Ag (30 min TAT) - Nasal Swab (BD Veritor Kit)     Status: None   Collection Time: 10/11/19  5:45 PM   Specimen: Nasal Swab (BD Veritor Kit)  Result Value Ref Range Status   SARS Coronavirus 2 Ag NEGATIVE NEGATIVE Final    Comment: (NOTE) SARS-CoV-2 antigen NOT DETECTED.  Negative results are presumptive.  Negative results do not preclude SARS-CoV-2 infection and should not be used as the sole basis for treatment or other Bruce Stanley management decisions, including infection  control decisions, particularly in the presence of clinical signs and  symptoms consistent with COVID-19, or in those who have been in contact with the virus.  Negative results must be combined with clinical observations, Bruce Stanley history, and epidemiological information. The expected result is Negative. Fact Sheet for Patients: PodPark.tn Fact Sheet for Healthcare Providers: GiftContent.is This test is not yet approved or cleared by the [place] FDA and  has been authorized for detection and/or diagnosis of SARS-CoV-2 by FDA under an Emergency Use Authorization (EUA).  This EUA will remain in effect (meaning this test can be used) for the duration of  the COVID-19 de claration under Section 564(b)(1) of the Act, 21 U.S.C. section 360bbb-3(b)(1), unless the authorization is terminated or revoked sooner. Performed at [facility], [address] [place], [place] [address]   Blood Culture (routine x 2)     Status: None  (Preliminary result)   Collection Time: 10/11/19  5:53 PM   Specimen: BLOOD LEFT ARM  Result Value Ref Range Status   Specimen Description   Final    BLOOD LEFT ARM Performed at [facility], [address] [place], [place] [address]    Special Requests   Final    BOTTLES DRAWN AEROBIC AND ANAEROBIC Blood Culture adequate volume Performed at [facility], [address] [place], [place] [address]    Culture   Final    NO GROWTH < 12 HOURS Performed at [facility], [place] [address] [place], [place] [address]    Report Status PENDING  Incomplete  Respiratory Panel by RT PCR (Flu A&B, Covid) - Nasopharyngeal Swab     Status: Abnormal   Collection Time: 10/11/19  6:00 PM   Specimen: Nasopharyngeal Swab  Result Value Ref Range Status   SARS Coronavirus 2 by RT PCR POSITIVE (A) NEGATIVE Final    Comment: RESULT CALLED TO, READ BACK BY AND VERIFIED WITH: K NEAL RN 10/11/19 22 01 JDW (NOTE) SARS-CoV-2 target nucleic acids are DETECTED. SARS-CoV-2 RNA is generally detectable in upper respiratory specimens  during the acute phase of infection. Positive results are indicative of the presence of the identified virus, but do not rule out bacterial infection or co-infection with other pathogens not detected by the test. Clinical correlation with Bruce Stanley history and other diagnostic information is necessary to determine Bruce Stanley infection status. The expected result is Negative. Fact Sheet for Patients:  PinkCheek.be Fact Sheet for Healthcare Providers: GravelBags.it This test is not yet approved or cleared by the [place] FDA and  has been authorized for detection and/or diagnosis of SARS-CoV-2 by FDA under an Emergency Use Authorization (EUA).  This EUA will remain in effect (meaning this test can be used) for the d uration of  the COVID-19 declaration under Section 564(b)(1) of the Act, 21 U.S.C.  section 360bbb-3(b)(1), unless the authorization is terminated or revoked sooner.    Influenza A by PCR NEGATIVE NEGATIVE Final   Influenza B by PCR NEGATIVE NEGATIVE Final    Comment: (NOTE) The Xpert Xpress SARS-CoV-2/FLU/RSV assay is intended as an aid in  the diagnosis of influenza from Nasopharyngeal swab specimens and  should not be used as a sole basis for treatment. Nasal washings and  aspirates are unacceptable for Xpert Xpress SARS-CoV-2/FLU/RSV  testing. Fact Sheet for Patients: PinkCheek.be Fact Sheet for Healthcare Providers: GravelBags.it This test is not yet approved or cleared by the [place] FDA and  has been authorized for detection and/or diagnosis of SARS-CoV-2 by  FDA under an Emergency Use Authorization (EUA). This EUA will remain  in effect (meaning this test can be used)  for the duration of the  Covid-19 declaration under Section 564(b)(1) of the Act, 21  U.S.C. section 360bbb-3(b)(1), unless the authorization is  terminated or revoked. Performed at [facility], [place] [address], [place], [place] [address]     Radiology Reports DG Chest Vass 1 View  Result Date: 10/11/2019 CLINICAL DATA:  Shortness of breath EXAM: PORTABLE CHEST 1 VIEW COMPARISON:  10/07/2019 FINDINGS: Interim development of bilateral left greater than right interstitial and ground-glass opacity, largely peripheral on the left. No pleural effusion. Mild cardiomegaly. No pneumothorax. IMPRESSION: Interim development of left greater than right interstitial and ground-glass opacity, suspicious for bilateral pneumonia, possible atypical or viral pneumonia. Electronically Signed   By: Donavan Foil M.D.   On: 10/11/2019 18:22

## 2019-10-12 NOTE — Evaluation (Signed)
Physical Therapy Evaluation Patient Details Name: Bruce Stanley MRN: [id number] DOB: 03-21-1946 Today's Date: 10/12/2019   History of Present Illness  74 y/o male w/ hx of DVT/PE on coumadin, blood clots, OSA w/ CPAP dependence, presented to ED with SOB and cough. Sx started 1/5 but pt had tested _ for COVID. Pt takes care of spouse at home w/ caregiver coming in once a week  Clinical Impression   Pt admitted with above diagnosis. PTA was living home with spouse and he was main caregiver, she has other caregivers come in with increased frequency now, but prepares food, cleans and does laundry etc in home. Pt currently with functional limitations due to the deficits listed below (see PT Problem List). Pt was at [facility] with functional mobility, was able to ambulate approx 4ft with no AD and SBA on 5L/min but desat to 80% and took increased time to recover sats to 90s, approx w/ cues for pursed lip breathing. Pt will benefit from skilled PT to increase their independence and safety with mobility to allow discharge to the venue listed below.       Follow Up Recommendations No PT follow up    Equipment Recommendations  None recommended by PT    Recommendations for Other Services OT consult     Precautions / Restrictions Precautions Precautions: Other (comment) Precaution Comments: 02 sats w/ activity Restrictions Weight Bearing Restrictions: No      Mobility  Bed Mobility Overal bed mobility: Modified Independent                Transfers Overall transfer level: Needs assistance Equipment used: None Transfers: Sit to/from Stand;Stand Pivot Transfers Sit to Stand: Supervision Stand pivot transfers: Supervision          Ambulation/Gait Ambulation/Gait assistance: Supervision Gait Distance (Feet): 48 Feet Assistive device: None Gait Pattern/deviations: Step-through pattern Gait velocity: fair   General Gait Details: ambulated on 5L/min via [place] and desat to 80%  min.   Stairs            Wheelchair Mobility    Modified Rankin (Stroke Patients Only)       Balance                                             Pertinent Vitals/Pain Pain Assessment: No/denies pain    Home Living Family/patient expects to be discharged to:: Private residence Living Arrangements: Spouse/significant other Available Help at Discharge: Family Type of Home: House Home Access: Stairs to enter   Secretary/administrator of Steps: 1 Home Layout: One level Home Equipment: Grab bars - toilet;Grab bars - tub/shower;Walker - 2 wheels;Cane - single point;Wheelchair - manual;Shower seat;Bedside commode      Prior Function Level of Independence: Independent               Hand Dominance   Dominant Hand: Left    Extremity/Trunk Assessment   Upper Extremity Assessment Upper Extremity Assessment: Overall WFL for tasks assessed    Lower Extremity Assessment Lower Extremity Assessment: Overall WFL for tasks assessed       Communication   Communication: No difficulties  Cognition Arousal/Alertness: Awake/alert Behavior During Therapy: WFL for tasks assessed/performed Overall Cognitive Status: Within Functional Limits for tasks assessed  General Comments      Exercises Other Exercises Other Exercises: reinforced use of incentive spirometer x 5  pulled 1092ml Other Exercises: flutter valve x 5 w/ cues   Assessment/Plan    PT Assessment Patient needs continued PT services  PT Problem List Decreased activity tolerance;Decreased balance;Decreased coordination;Decreased mobility       PT Treatment Interventions Gait training;Functional mobility training;DME instruction;Therapeutic activities;Therapeutic exercise;Balance training;Neuromuscular re-education;Patient/family education    PT Goals (Current goals can be found in the Care Plan section)  Acute Rehab PT  Goals Patient Stated Goal: to get home in next few days PT Goal Formulation: With patient Time For Goal Achievement: 10/26/19 Potential to Achieve Goals: Fair    Frequency Min 3X/week   Barriers to discharge   pt is main caregiver of spouse    Co-evaluation               AM-PAC PT "6 Clicks" Mobility  Outcome Measure Help needed turning from your back to your side while in a flat bed without using bedrails?: None Help needed moving from lying on your back to sitting on the side of a flat bed without using bedrails?: None Help needed moving to and from a bed to a chair (including a wheelchair)?: A Little Help needed standing up from a chair using your arms (e.g., wheelchair or bedside chair)?: A Little Help needed to walk in hospital room?: A Little Help needed climbing 3-5 steps with a railing? : A Little 6 Click Score: 20    End of Session Equipment Utilized During Treatment: Oxygen Activity Tolerance: Treatment limited secondary to medical complications (Comment) Patient left: in bed;with call bell/phone within reach Nurse Communication: Mobility status PT Visit Diagnosis: Other abnormalities of gait and mobility (R26.89)    Time: 7017-7939 PT Time Calculation (min) (ACUTE ONLY): 26 min   Charges:   PT Evaluation $PT Eval Moderate Complexity: 1 Mod PT Treatments $Gait Training: 8-22 mins        Horald Chestnut, PT   Delford Field 10/12/2019, 1:17 PM

## 2019-10-12 NOTE — Consult Note (Signed)
[place] for Actemra dosing Indication: COVID-19 induced PNA  Patient Measurements: Height: 6' (182.9 cm) Weight: 260 lb (117.9 kg) IBW/kg (Calculated) : 77.6  Vital Signs: Temp: 97.7 F (36.5 C) (01/16 1119) Temp Source: Oral (01/16 1119) BP: 143/81 (01/16 1119) Pulse Rate: 85 (01/16 0803) Intake/Output from previous day: 01/15 0701 - 01/16 0700 In: 0  Out: 100 [Urine:100] Intake/Output from this shift: No intake/output data recorded.  Labs: Recent Labs    10/11/19 1745 10/12/19 0835  WBC 8.7 6.0  HGB 15.9 15.3  HCT 46.8 45.7  PLT 399 376  CREATININE 1.02 1.00  ALBUMIN 3.2* 3.2*  PROT 6.8 7.6  AST 42* 40  ALT 37 39  ALKPHOS 63 63  BILITOT 0.9 0.6   Estimated Creatinine Clearance: 85.9 mL/min (by C-G formula based on SCr of 1 mg/dL).   Microbiology: Recent Results (from the past 720 hour(s))  Blood Culture (routine x 2)     Status: None (Preliminary result)   Collection Time: 10/11/19  5:45 PM   Specimen: BLOOD RIGHT ARM  Result Value Ref Range Status   Specimen Description   Final    BLOOD RIGHT ARM Performed at [facility], [address] [place], [place] [address]    Special Requests   Final    BOTTLES DRAWN AEROBIC AND ANAEROBIC Blood Culture adequate volume Performed at [facility], [address] [place], [place] [address]    Culture   Final    NO GROWTH < 12 HOURS Performed at [facility], [place] [address] [place], [place] [address]    Report Status PENDING  Incomplete  SARS Coronavirus 2 Ag (30 min TAT) - Nasal Swab (BD Veritor Kit)     Status: None   Collection Time: 10/11/19  5:45 PM   Specimen: Nasal Swab (BD Veritor Kit)  Result Value Ref Range Status   SARS Coronavirus 2 Ag NEGATIVE NEGATIVE Final    Comment: (NOTE) SARS-CoV-2 antigen NOT DETECTED.  Negative results are presumptive.  Negative results do not preclude SARS-CoV-2 infection and should not be used as  the sole basis for treatment or other patient management decisions, including infection  control decisions, particularly in the presence of clinical signs and  symptoms consistent with COVID-19, or in those who have been in contact with the virus.  Negative results must be combined with clinical observations, patient history, and epidemiological information. The expected result is Negative. Fact Sheet for Patients: PodPark.tn Fact Sheet for Healthcare Providers: GiftContent.is This test is not yet approved or cleared by the [place] FDA and  has been authorized for detection and/or diagnosis of SARS-CoV-2 by FDA under an Emergency Use Authorization (EUA).  This EUA will remain in effect (meaning this test can be used) for the duration of  the COVID-19 de claration under Section 564(b)(1) of the Act, 21 U.S.C. section 360bbb-3(b)(1), unless the authorization is terminated or revoked sooner. Performed at [facility], [address] [place], [place] [address]   Blood Culture (routine x 2)     Status: None (Preliminary result)   Collection Time: 10/11/19  5:53 PM   Specimen: BLOOD LEFT ARM  Result Value Ref Range Status   Specimen Description   Final    BLOOD LEFT ARM Performed at [facility], [address] [place], [place] [address]    Special Requests   Final    BOTTLES DRAWN AEROBIC  AND ANAEROBIC Blood Culture adequate volume Performed at [facility], [address] [place], [place] [address]    Culture   Final    NO GROWTH < 12 HOURS Performed at [facility], [place] [address], [place], [place] [address]    Report Status PENDING  Incomplete  Respiratory Panel by RT PCR (Flu A&B, Covid) - Nasopharyngeal Swab     Status: Abnormal   Collection Time: 10/11/19  6:00 PM   Specimen: Nasopharyngeal Swab  Result Value Ref Range Status   SARS Coronavirus 2 by RT PCR POSITIVE (A)  NEGATIVE Final    Comment: RESULT CALLED TO, READ BACK BY AND VERIFIED WITH: K NEAL RN 10/11/19 22 01 JDW (NOTE) SARS-CoV-2 target nucleic acids are DETECTED. SARS-CoV-2 RNA is generally detectable in upper respiratory specimens  during the acute phase of infection. Positive results are indicative of the presence of the identified virus, but do not rule out bacterial infection or co-infection with other pathogens not detected by the test. Clinical correlation with patient history and other diagnostic information is necessary to determine patient infection status. The expected result is Negative. Fact Sheet for Patients:  PinkCheek.be Fact Sheet for Healthcare Providers: GravelBags.it This test is not yet approved or cleared by the [place] FDA and  has been authorized for detection and/or diagnosis of SARS-CoV-2 by FDA under an Emergency Use Authorization (EUA).  This EUA will remain in effect (meaning this test can be used) for the d uration of  the COVID-19 declaration under Section 564(b)(1) of the Act, 21 U.S.C. section 360bbb-3(b)(1), unless the authorization is terminated or revoked sooner.    Influenza A by PCR NEGATIVE NEGATIVE Final   Influenza B by PCR NEGATIVE NEGATIVE Final    Comment: (NOTE) The Xpert Xpress SARS-CoV-2/FLU/RSV assay is intended as an aid in  the diagnosis of influenza from Nasopharyngeal swab specimens and  should not be used as a sole basis for treatment. Nasal washings and  aspirates are unacceptable for Xpert Xpress SARS-CoV-2/FLU/RSV  testing. Fact Sheet for Patients: PinkCheek.be Fact Sheet for Healthcare Providers: GravelBags.it This test is not yet approved or cleared by the [place] FDA and  has been authorized for detection and/or diagnosis of SARS-CoV-2 by  FDA under an Emergency Use Authorization (EUA). This EUA will  remain  in effect (meaning this test can be used) for the duration of the  Covid-19 declaration under Section 564(b)(1) of the Act, 21  U.S.C. section 360bbb-3(b)(1), unless the authorization is  terminated or revoked. Performed at [facility], [place] [address] [place],  [address]   SARS Coronavirus 2 by RT PCR (hospital order, performed in [facility] hospital lab) Nasopharyngeal Nasopharyngeal Swab     Status: Abnormal   Collection Time: 10/11/19  7:59 PM   Specimen: Nasopharyngeal Swab  Result Value Ref Range Status   SARS Coronavirus 2 POSITIVE (A) NEGATIVE Final    Comment: RESULT CALLED TO, READ BACK BY AND VERIFIED WITHJosph Macho RN [phone] [id number] PHILLIPS C Performed at [facility], [address] [place], [place] [address]     Medical History: Past Medical History:  Diagnosis Date  . CPAP (continuous positive airway pressure) dependence   . H/O blood clots   . PE (pulmonary thromboembolism) (HCC)     Medications:  Scheduled:  . vitamin C  500 mg Oral Daily  . atorvastatin  40 mg Oral q1800  . dexamethasone (DECADRON) injection  10  mg Intravenous Q24H  . famotidine  20 mg Oral Daily  . fluticasone  2 spray Each Nare Daily  . insulin aspart  0-9 Units Subcutaneous TID WC  . Ipratropium-Albuterol  1 puff Inhalation Q6H  . omega-3 acid ethyl esters  1 g Oral Daily  . Warfarin - Pharmacist Dosing Inpatient   Does not apply q1800  . zinc sulfate  220 mg Oral Daily    Assessment: 74 y.o. 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 LFTs are wnl at baseline, O2 83%, neutrophils 5.3 K/uL, PLT  376 K/uL, no h/o TB, GI perforation,   Plan:   Tocilizumab recommended dose for patients > 30 kg is 8 mg/kg. Maximum dose 800 mg  Patient weight 117.9 kg: dose 800 mg IV x1  Dallie Piles 10/12/2019,11:54 AM

## 2019-10-12 NOTE — Plan of Care (Signed)
Pt arrived via ems. A&Ox4, NAD denies sob at this time. Head to toe skin assessment completed no skins of skin breakdown noted. Pt arrived on high flow nasal cannula sating at 97% on 7L pt oriented to room and call bell system. Will continue to monitor.

## 2019-10-12 NOTE — Plan of Care (Signed)
Sent message to provider notify new pt arrival.

## 2019-10-12 NOTE — Progress Notes (Signed)
Pt with coughing episode.  O2 sat down to 80s.  New pulse ox placed.  Pt changed to 10L HFNC.  PRN ventolin and robitussin administered.

## 2019-10-12 NOTE — H&P (Signed)
History and Physical    Bruce Stanley BLT:[id number] DOB: 03/26/1946 DOA: 10/11/2019  PCP: Patient, No Pcp Per  Patient coming from: Home/[facility]  I have personally briefly reviewed patient's old medical records in [facility]  Chief Complaint: SOB  HPI: Bruce Stanley is a 74 y.o. 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  His onset of respiratory symptoms began on 1/5, patient reports he had cough/sob and was treated with flonase, benzonatate and doxycycline without improvement.  He states he had no improvement and worsening cough thus was seen in respiratory clinic (phone calls document no improvement of symptoms as well as questionable anosmia, which he now denies) and reports he tested negative again for COVID, told his CXR was clear and given prednisone.  He reports given his Hx of PE he has a pulse ox at home and though despite not necessarily having fevers, chills, worsening cough/sob he noted this his pulse oximetry read in the 70s thus he called his friend to bring him in.  He is unsure of where he got it, states he takes care of his disabled wife at home and they only have a caregiver who comes in once a week to assist with her showering who did not have any symptoms.  Again he denies any fevers, chills, diarrhea, anosmia.  He denies smoking, alcohol or drugs  Wishes to be DNR/DNI  Review of Systems: As per HPI otherwise 10 point review of systems negative.    Past Medical History:  Diagnosis Date  . CPAP (continuous positive airway pressure) dependence   . H/O blood clots   . PE (pulmonary thromboembolism) (HCC)      reports that he has never smoked. He has never used smokeless tobacco. He reports that he does not drink alcohol or use drugs.  Allergies  Allergen Reactions  . Iodine Itching    Family Hx - reviewed and non-contributory  Prior to Admission medications     Medication Sig Start Date End Date Taking? Authorizing Provider  albuterol (VENTOLIN HFA) 108 (90 Base) MCG/ACT inhaler Inhale into the lungs. 10/07/19 11/06/19 Yes [provider]  aspirin 81 MG EC tablet Take by mouth. 11/07/13  Yes [provider]  atorvastatin (LIPITOR) 40 MG tablet TAKE 1 TABLET(40 MG) BY MOUTH DAILY 01/16/17  Yes [provider]  doxycycline (VIBRA-TABS) 100 MG tablet Take by mouth. 10/04/19 10/14/19 Yes [provider]  fluticasone (FLONASE) 50 MCG/ACT nasal spray 2 sprays by Each Nare route daily. 10/01/19  Yes [provider]  guaiFENesin-codeine (ROBITUSSIN AC) 100-10 MG/5ML syrup Take by mouth. 10/07/19 10/17/19 Yes [provider]  Omega-3 Fatty Acids (FISH OIL) 1000 MG CAPS TAKE 1 CAPSULE BY MOUTH DAILY 08/16/16  Yes [provider]  predniSONE (DELTASONE) 5 MG tablet Take by mouth. 10/07/19 10/13/19 Yes [provider]  warfarin (COUMADIN) 5 MG tablet Take by mouth. 01/17/17 12/09/19 Yes [provider]    Physical Exam: Vitals:   10/12/19 0000 10/12/19 0030 10/12/19 0158 10/12/19 0522  BP: (!) 152/95 (!) 158/95 (!) 151/91 116/66  Pulse: 92 89 92 78  Resp: (!) 24 (!) 27 (!) 27 (!) 21  Temp:      TempSrc:      SpO2: 92% 95% 91% 97%  Weight:      Height:        Vitals:   10/12/19 0000 10/12/19 0030 10/12/19 0158 10/12/19 0522  BP: (!) 152/95 (!) 158/95 (!) 151/91 116/66  Pulse: 92 89 92 78  Resp: (!) 24 (!) 27 (!) 27 (!) 21  Temp:      TempSrc:      SpO2: 92% 95% 91% 97%  Weight:      Height:         Constitutional: NAD, calm, comfortable, wearing nasal cannula   Eyes: PERRL, lids and conjunctivae normal ENMT: Mucous membranes are moist. Posterior pharynx clear of any exudate or lesions.Normal dentition.  Neck: normal, supple, no masses, no thyromegaly Respiratory: shallow, tachypneic, bilateral crackles Cardiovascular: Regular rate and rhythm, no murmurs / rubs / gallops. No  extremity edema. 2+ pedal pulses. No carotid bruits.  Abdomen: no tenderness, no masses palpated. No hepatosplenomegaly. Bowel sounds positive.  Musculoskeletal: no clubbing / cyanosis. No joint deformity upper and lower extremities. Good ROM, no contractures. Normal muscle tone.  Skin: no rashes, lesions, ulcers. No induration Neurologic: CN 2-12 grossly intact. Sensation and strength intact Psychiatric: Normal judgment and insight. Alert and oriented x 3. Normal mood.     Labs on Admission: I have personally reviewed following labs and imaging studies  CBC: Recent Labs  Lab 10/11/19 1745  WBC 8.7  NEUTROABS 7.4  HGB 15.9  HCT 46.8  MCV 90.3  PLT 947   Basic Metabolic Panel: Recent Labs  Lab 10/11/19 1745  NA 139  K 3.9  CL 102  CO2 24  GLUCOSE 140*  BUN 24*  CREATININE 1.02  CALCIUM 8.9   GFR: Estimated Creatinine Clearance: 84.2 mL/min (by C-G formula based on SCr of 1.02 mg/dL). Liver Function Tests: Recent Labs  Lab 10/11/19 1745  AST 42*  ALT 37  ALKPHOS 63  BILITOT 0.9  PROT 6.8  ALBUMIN 3.2*   No results for input(s): LIPASE, AMYLASE in the last 168 hours. No results for input(s): AMMONIA in the last 168 hours. Coagulation Profile: No results for input(s): INR, PROTIME in the last 168 hours. Cardiac Enzymes: No results for input(s): CKTOTAL, CKMB, CKMBINDEX, TROPONINI in the last 168 hours. BNP (last 3 results) No results for input(s): PROBNP in the last 8760 hours. HbA1C: No results for input(s): HGBA1C in the last 72 hours. CBG: No results for input(s): GLUCAP in the last 168 hours. Lipid Profile: Recent Labs    10/11/19 1745  TRIG 145   Thyroid Function Tests: No results for input(s): TSH, T4TOTAL, FREET4, T3FREE, THYROIDAB in the last 72 hours. Anemia Panel: Recent Labs    10/11/19 1745  FERRITIN 765*   Urine analysis: No results found for: COLORURINE, APPEARANCEUR, LABSPEC, PHURINE, GLUCOSEU, HGBUR, BILIRUBINUR, KETONESUR,  PROTEINUR, UROBILINOGEN, NITRITE, LEUKOCYTESUR  Radiological Exams on Admission: DG Chest Port 1 View  Result Date: 10/11/2019 CLINICAL DATA:  Shortness of breath EXAM: PORTABLE CHEST 1 VIEW COMPARISON:  10/07/2019 FINDINGS: Interim development of bilateral left greater than right interstitial and ground-glass opacity, largely peripheral on the left. No pleural effusion. Mild cardiomegaly. No pneumothorax. IMPRESSION: Interim development of left greater than right interstitial and ground-glass opacity, suspicious for bilateral pneumonia, possible atypical or viral pneumonia. Electronically Signed   By: Donavan Foil M.D.   On: 10/11/2019 18:22      Assessment/Plan Bruce Stanley is a 74 y.o. 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  Onset of COVID symptoms difficult to pinpoint as it may have been as early as 1/5 or anywhere along this timeline, but given uncertainty of onset and cannot definitively state > 10 days since onset, will continue with Dexamethasone and Remdesivir. 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  10/12/2019, 5:33 AM

## 2019-10-12 NOTE — Plan of Care (Signed)
  Problem: Education: Goal: Knowledge of risk factors and measures for prevention of condition will improve Outcome: Progressing   Problem: Coping: Goal: Psychosocial and spiritual needs will be supported Outcome: Progressing   Problem: Respiratory: Goal: Complications related to the disease process, condition or treatment will be avoided or minimized Outcome: Progressing   Problem: Respiratory: Goal: Will maintain a patent airway Outcome: Not Progressing   

## 2019-10-12 NOTE — Progress Notes (Signed)
ANTICOAGULATION CONSULT NOTE - Initial Consult  Pharmacy Consult for warfarin dosing Indication:  Hx of DVT/PE, lifelong anti-coagulation  Allergies  Allergen Reactions  . Iodine Itching    Patient Measurements: Height: 6' (182.9 cm) Weight: 260 lb (117.9 kg) IBW/kg (Calculated) : 77.6 Heparin Dosing Weight: HEPARIN DW (KG): 103.3  Vital Signs: BP: 116/66 (01/16 0522) Pulse Rate: 78 (01/16 0522)  Labs: Recent Labs    10/11/19 1745 10/11/19 1951 10/12/19 0423  HGB 15.9  --   --   HCT 46.8  --   --   PLT 399  --   --   LABPROT  --   --  40.6*  INR  --   --  4.2*  CREATININE 1.02  --   --   TROPONINIHS 9 7  --     Estimated Creatinine Clearance: 84.2 mL/min (by C-G formula based on SCr of 1.02 mg/dL).  Medical History: Past Medical History:  Diagnosis Date  . CPAP (continuous positive airway pressure) dependence   . H/O blood clots   . PE (pulmonary thromboembolism) (HCC)      Assessment:  Pharmacy consulted to dose warfarin for this 75 yo male on chronic anti-coagulation with warfarin for history of DVT/PE.  Patient currently has a supra-therapeutic INR of 4.2.  Admission CBC is WNL and his D-Dimer is 0.65mcg/mL-FEU.  Home dose: warfarin 5mg  daily Last dose:  warfarin 5mg  on 10/10/19  Drug Interactions: azithromycin; pt had rx for doxycycline PTA Concurrent anti-platelet medications: patient was taking aspirin 81mg /day PTA  Goal of Therapy:  INR 2-3 Monitor platelets by anticoagulation protocol: Yes   Plan:  Hold warfarin today for supra-therapeutic INR of 4.2 Daily PT/INR  Monitor for signs and symptoms of bleeding. Educate if/when pertinent  10/12/2019,7:15 AM

## 2019-10-13 LAB — CBC WITH DIFFERENTIAL/PLATELET
Abs Immature Granulocytes: 0.07 10*3/uL (ref 0.00–0.07)
Basophils Absolute: 0 10*3/uL (ref 0.0–0.1)
Basophils Relative: 0 %
Eosinophils Absolute: 0 10*3/uL (ref 0.0–0.5)
Eosinophils Relative: 0 %
HCT: 41 % (ref 39.0–52.0)
Hemoglobin: 13.5 g/dL (ref 13.0–17.0)
Immature Granulocytes: 1 %
Lymphocytes Relative: 7 %
Lymphs Abs: 0.6 10*3/uL — ABNORMAL LOW (ref 0.7–4.0)
MCH: 30.1 pg (ref 26.0–34.0)
MCHC: 32.9 g/dL (ref 30.0–36.0)
MCV: 91.3 fL (ref 80.0–100.0)
Monocytes Absolute: 0.2 10*3/uL (ref 0.1–1.0)
Monocytes Relative: 2 %
Neutro Abs: 7.9 10*3/uL — ABNORMAL HIGH (ref 1.7–7.7)
Neutrophils Relative %: 90 %
Platelets: 367 10*3/uL (ref 150–400)
RBC: 4.49 MIL/uL (ref 4.22–5.81)
RDW: 13.5 % (ref 11.5–15.5)
WBC: 8.8 10*3/uL (ref 4.0–10.5)
nRBC: 0 % (ref 0.0–0.2)

## 2019-10-13 LAB — COMPREHENSIVE METABOLIC PANEL
ALT: 35 U/L (ref 0–44)
AST: 33 U/L (ref 15–41)
Albumin: 2.9 g/dL — ABNORMAL LOW (ref 3.5–5.0)
Alkaline Phosphatase: 53 U/L (ref 38–126)
Anion gap: 10 (ref 5–15)
BUN: 36 mg/dL — ABNORMAL HIGH (ref 8–23)
CO2: 26 mmol/L (ref 22–32)
Calcium: 8.7 mg/dL — ABNORMAL LOW (ref 8.9–10.3)
Chloride: 102 mmol/L (ref 98–111)
Creatinine, Ser: 0.88 mg/dL (ref 0.61–1.24)
GFR calc Af Amer: >60 mL/min (ref >60)
GFR calc non Af Amer: >60 mL/min (ref >60)
Glucose, Bld: 142 mg/dL — ABNORMAL HIGH (ref 70–99)
Potassium: 4 mmol/L (ref 3.5–5.1)
Sodium: 138 mmol/L (ref 135–145)
Total Bilirubin: 1 mg/dL (ref 0.3–1.2)
Total Protein: 6.5 g/dL (ref 6.5–8.1)

## 2019-10-13 LAB — GLUCOSE, CAPILLARY
Glucose-Capillary: 142 mg/dL — ABNORMAL HIGH (ref 70–99)
Glucose-Capillary: 157 mg/dL — ABNORMAL HIGH (ref 70–99)
Glucose-Capillary: 115 mg/dL — ABNORMAL HIGH (ref 70–99)
Glucose-Capillary: 148 mg/dL — ABNORMAL HIGH (ref 70–99)

## 2019-10-13 LAB — PROTIME-INR
INR: 3.5 — ABNORMAL HIGH (ref 0.8–1.2)
Prothrombin Time: 34.7 seconds — ABNORMAL HIGH (ref 11.4–15.2)

## 2019-10-13 LAB — C-REACTIVE PROTEIN: CRP: 6.8 mg/dL — ABNORMAL HIGH (ref <1.0)

## 2019-10-13 LAB — BRAIN NATRIURETIC PEPTIDE: B Natriuretic Peptide: 92.6 pg/mL (ref 0.0–100.0)

## 2019-10-13 LAB — FERRITIN: Ferritin: 758 ng/mL — ABNORMAL HIGH (ref 24–336)

## 2019-10-13 LAB — D-DIMER, QUANTITATIVE: D-Dimer, Quant: 0.81 ug/mL-FEU — ABNORMAL HIGH (ref 0.00–0.50)

## 2019-10-13 NOTE — Progress Notes (Signed)
Able to be on [facility] today with sats in the 90's.  On room air, patient can be 90% or will go down into the mid 80's. Upon transferring to the toilet on RA, patient desatted to 76%.  While walking he fell to 76% and O2 was placed at 4 LNC.  Quickly recovered and was left on 1 LNC.

## 2019-10-13 NOTE — Progress Notes (Signed)
ANTICOAGULATION CONSULT NOTE - Initial Consult  Pharmacy Consult for warfarin dosing Indication:  Hx of DVT/PE, lifelong anti-coagulation  Allergies  Allergen Reactions  . Iodine Itching    Patient Measurements: Height: 6' (182.9 cm) Weight: 260 lb (117.9 kg) IBW/kg (Calculated) : 77.6 Heparin Dosing Weight: HEPARIN DW (KG): 103.3  Vital Signs: Temp: 97.8 F (36.6 C) (01/17 1259) Temp Source: Oral (01/17 1259) BP: 129/89 (01/17 1259) Pulse Rate: 71 (01/17 0748)  Labs: Recent Labs    10/11/19 1745 10/11/19 1745 10/11/19 1951 10/12/19 0423 10/12/19 0835 10/13/19 0304  HGB 15.9   < >  --   --  15.3 13.5  HCT 46.8  --   --   --  45.7 41.0  PLT 399  --   --   --  376 367  LABPROT  --   --   --  40.6*  --  34.7*  INR  --   --   --  4.2*  --  3.5*  CREATININE 1.02  --   --   --  1.00 0.88  TROPONINIHS 9  --  7  --   --   --    < > = values in this interval not displayed.    Estimated Creatinine Clearance: 97.6 mL/min (by C-G formula based on SCr of 0.88 mg/dL).  Medical History: Past Medical History:  Diagnosis Date  . CPAP (continuous positive airway pressure) dependence   . H/O blood clots   . PE (pulmonary thromboembolism) (HCC)      Assessment:  Pharmacy consulted to dose warfarin for this 74 yo male on chronic anti-coagulation with warfarin for history of DVT/PE.  Patient currently has a supra-therapeutic INR of 4.2.  Admission CBC is WNL and his D-Dimer is 0.4mcg/mL-FEU.  Home dose: warfarin 5mg  daily Last dose:  warfarin 5mg  on 10/10/19   INR is trending down but still >3. We will continue to hold coumadin.   Goal of Therapy:  INR 2-3 Monitor platelets by anticoagulation protocol: Yes   Plan:  No coumadin today Daily PT/INR  Monitor for signs and symptoms of bleeding.  , PharmD, BCIDP, AAHIVP, CPP Infectious Disease Pharmacist 10/13/2019 2:43 PM

## 2019-10-13 NOTE — Progress Notes (Signed)
Occupational Therapy Evaluation Patient Details Name: Bruce Stanley MRN: [id number] DOB: Feb 23, 1946 Today's Date: 10/13/2019    History of Present Illness 74 y/o male w/ hx of DVT/PE on coumadin, blood clots, OSA w/ CPAP dependence, presented to ED with SOB and cough. Sx started 1/5 but pt had tested _ for COVID. Pt takes care of spouse at home w/ caregiver coming in once a week   Clinical Impression   PTA pt lived with his wife, independent in all ADL, IADL, and mobility tasks. Pt does not ambulate with an assistive device and reports 0 falls in the last 6 months. Pt does not use oxygen at home (besides CPAP at night) and currently is on 3L [place]. Educated pt on breathing exercises with good understanding and follow through. Educated and provided pt with handout in regards to energy conservation with good understanding. Pt able to ambulate to/from bathroom without an assistive device, noting 1 instance of loss of balance with pt able to self-correct. Pt able to complete toileting task and stand 1 x 2 min at the sink to brush teeth, utilizing 3L [place]. SpO2 decreased to 68% following task with pt unable to increase past 82% following mod seated rest break. Oxygen increased to 4L [place] with pt returning to 90s after ~2 minutes. Pt reported min shortness of breath throughout. RN present and updated. Pt demonstrates decreased strength, endurance, balance, standing tolerance, and activity tolerance impacting ability to complete self-care and functional transfer tasks. Recommend skilled OT services to address above deficits in order to promote function and prevent further decline.     Follow Up Recommendations  No OT follow up;Supervision - Intermittent    Equipment Recommendations  None recommended by OT    Recommendations for Other Services       Precautions / Restrictions Precautions Precautions: Other (comment) Precaution Comments: Monitor O2 SATs, desats quickly Restrictions Weight Bearing  Restrictions: No      Mobility Bed Mobility Overal bed mobility: Modified Independent                Transfers Overall transfer level: Needs assistance Equipment used: None Transfers: Sit to/from Stand;Stand Pivot Transfers Sit to Stand: Supervision Stand pivot transfers: Supervision            Balance Overall balance assessment: Mild deficits observed, not formally tested                                         ADL either performed or assessed with clinical judgement   ADL Overall ADL's : Needs assistance/impaired Eating/Feeding: Independent;Sitting   Grooming: Supervision/safety;Set up;Standing   Upper Body Bathing: Set up;Supervision/ safety;Standing   Lower Body Bathing: Supervison/ safety;Set up;Sit to/from stand   Upper Body Dressing : Set up;Sitting   Lower Body Dressing: Set up;Supervision/safety;Sit to/from stand   Toilet Transfer: Sport and exercise psychologist;Ambulation   Toileting- Clothing Manipulation and Hygiene: Supervision/safety;Set up;Sit to/from stand       Functional mobility during ADLs: Set up;Supervision/safety General ADL Comments: Pt able to ambulate to/from bathroom without an assistive device. Noted 1 instance of LOB with pt able to self-correct. Pt required min cues for safety and to slow pace.     Vision Baseline Vision/History: Wears glasses Wears Glasses: Reading only       Perception     Praxis      Pertinent Vitals/Pain Pain Assessment: No/denies pain     Hand  Dominance Left   Extremity/Trunk Assessment Upper Extremity Assessment Upper Extremity Assessment: Overall WFL for tasks assessed   Lower Extremity Assessment Lower Extremity Assessment: Defer to PT evaluation       Communication Communication Communication: No difficulties   Cognition Arousal/Alertness: Awake/alert Behavior During Therapy: WFL for tasks assessed/performed Overall Cognitive Status: Within Functional  Limits for tasks assessed                                     General Comments       Exercises Exercises: Other exercises Other Exercises Other Exercises: Incentive spirometer x 5 pulling . Additional 1 x 5 pulling Other Exercises: Flutter valve x 10   Shoulder Instructions      Home Living Family/patient expects to be discharged to:: Private residence Living Arrangements: Spouse/significant other Available Help at Discharge: Family Type of Home: House Home Access: Stairs to enter Secretary/administrator of Steps: 1   Home Layout: One level     Bathroom Shower/Tub: Tub/shower unit;Walk-in shower   Bathroom Toilet: Handicapped height Bathroom Accessibility: Yes   Home Equipment: Grab bars - toilet;Grab bars - tub/shower;Walker - 2 wheels;Cane - single point;Wheelchair - manual;Shower seat;Bedside commode          Prior Functioning/Environment Level of Independence: Independent        Comments: Pt independent in ADLs, IADLs, and mobility. Pt does not ambulate with an assistive device and reports 0 falls in the last 6 months. Pt still drives. 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        OT Problem List: Decreased strength;Decreased activity tolerance;Impaired balance (sitting and/or standing);Decreased safety awareness;Cardiopulmonary status limiting activity      OT Treatment/Interventions: Self-care/ADL training;Therapeutic exercise;Neuromuscular education;Energy conservation;DME and/or AE instruction;Therapeutic activities;Patient/family education;Balance training    OT Goals(Current goals can be found in the care plan section) Acute Rehab OT Goals Patient Stated Goal: to go home Time For Goal Achievement: 10/27/19 Potential to Achieve Goals: Good ADL Goals Pt Will Perform Grooming: Independently;standing Pt Will Perform Lower Body Bathing: with modified independence;sit to/from stand Pt Will  Perform Lower Body Dressing: with modified independence;sit to/from stand Pt Will Transfer to Toilet: Independently;ambulating;regular height toilet Pt Will Perform Toileting - Clothing Manipulation and hygiene: Independently;sit to/from stand Additional ADL Goal #1: Pt to recall and verbalize 3 energy conservation strategies with 0 verbal cues. Additional ADL Goal #2: Pt to recall and verbalize 3 fall prevention strategies with 0 verbal cues. Additional ADL Goal #3: Pt to tolerate standing up to 10 min independently with SpO2 maintaining in 90s, in preparation for ADLs.  OT Frequency: Min 3X/week   Barriers to D/C:            Co-evaluation              AM-PAC OT "6 Clicks" Daily Activity     Outcome Measure Help from another person eating meals?: None Help from another person taking care of personal grooming?: A Little Help from another person toileting, which includes using toliet, bedpan, or urinal?: A Little Help from another person bathing (including washing, rinsing, drying)?: A Little Help from another person to put on and taking off regular upper body clothing?: A Little Help from another person to put on and taking off regular lower body clothing?: A Little 6 Click Score: 19   End of Session Equipment Utilized During Treatment: Oxygen  Nurse Communication: Mobility status  Activity Tolerance: Patient limited by fatigue(Limited by SOB) Patient left: in chair;with call bell/phone within reach;with nursing/sitter in room  OT Visit Diagnosis: Unsteadiness on feet (R26.81);Muscle weakness (generalized) (M62.81)                Time: 0488-8916 OT Time Calculation (min): 41 min Charges:  OT General Charges $OT Visit: 1 Visit OT Evaluation $OT Eval Low Complexity: 1 Low OT Treatments $Self Care/Home Management : 8-22 mins $Therapeutic Activity: 8-22 mins  Peterson Ao OTR/L [phone]   Peterson Ao 10/13/2019, 11:31 AM

## 2019-10-13 NOTE — Progress Notes (Signed)
PROGRESS NOTE                                                                                                                                                                                                             Patient Demographics:    Bruce Stanley, is a 74 y.o. male, DOB - [birth year], UMP:[id number]  Outpatient Primary MD for the patient is Patient, No Pcp Per   Admit date - 10/11/2019   LOS - 2  Chief Complaint  Patient presents with  . Shortness of Breath       Brief Narrative: Patient is a 74 y.o. 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  See below for further details.   Subjective:   Patient in bed, appears comfortable, denies any headache, no fever, no chest pain or pressure, improved shortness of breath , no abdominal pain. No focal weakness..   Assessment  & Plan :   Acute Hypoxic Resp Failure due to Covid 19 Viral pneumonia: He had severe disease with extreme hypoxia, was treated with IV steroids, remdesivir along with Actemra, excellent response, now down to 4-6 L nasal cannula oxygen at rest and is symptom-free.  Will continue to monitor.  Encouraged to sit up in chair and daytime use flutter valve and I-S for pulmonary toiletry and prone in bed at night.    O2 requirements:  SpO2: (!) 89 % O2 Flow Rate (L/min): 10 L/min   COVID-19 Labs: Recent Labs    10/11/19 1745 10/12/19 0835 10/13/19 0304  DDIMER 0.64* 0.94* 0.81*  FERRITIN 765* 898* 758*  LDH 568*  --   --   CRP 16.5* 14.8* 6.8*       Component Value Date/Time   BNP 92.6 10/13/2019 0830    Recent Labs  Lab 10/11/19 1745  PROCALCITON <0.10    Lab Results  Component Value Date   SARSCOV2NAA POSITIVE  (A) 10/11/2019   SARSCOV2NAA POSITIVE (A) 10/11/2019      Hepatic Function Latest Ref Rng & Units 10/13/2019 10/12/2019 10/11/2019  Total Protein 6.5 - 8.1 g/dL 6.5 7.6 6.8  Albumin 3.5 - 5.0 g/dL 2.9(L) 3.2(L) 3.2(L)  AST 15 - 41 U/L  33 40 42(H)  ALT 0 - 44 U/L 35 39 37  Alk Phosphatase 38 - 126 U/L 53 63 63  Total Bilirubin 0.3 - 1.2 mg/dL 1.0 0.6 0.9     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 Yes; Fluid consistency: Thin  Diet effective now               Disposition Plan  :  Remain hospitalized  Barriers to discharge: Hypoxia requiring O2 supplementation/complete 5 days of IV Remdesivir  Antimicorbials  :    Anti-infectives (From admission, onward)   Start     Dose/Rate Route Frequency Ordered Stop   10/12/19 1000  remdesivir 100 mg in sodium chloride 0.9 % 100 mL IVPB     100 mg 200 mL/hr over 30 Minutes Intravenous Daily 10/11/19 2209 10/16/19 0959   10/11/19 2215  remdesivir 100 mg in sodium chloride 0.9 % 100 mL IVPB     100 mg 200 mL/hr over 30 Minutes Intravenous Every 30 min 10/11/19 2209 10/12/19 0010   10/11/19 1900  cefTRIAXone (ROCEPHIN) 1 g in sodium chloride 0.9 % 100 mL IVPB     1 g 200 mL/hr over 30 Minutes Intravenous  Once 10/11/19 1857 10/11/19 2004   10/11/19 1900  azithromycin (ZITHROMAX) 500 mg in sodium chloride 0.9 % 250 mL IVPB     500 mg 250 mL/hr over 60 Minutes Intravenous  Once 10/11/19 1857 10/11/19 2100     DVT Prophylaxis  : Coumadin  Lab Results  Component Value Date   INR 3.5 (H) 10/13/2019   INR 4.2 (HH) 10/12/2019    Inpatient Medications  Scheduled Meds: . vitamin C  500  mg Oral Daily  . atorvastatin  40 mg Oral q1800  . dexamethasone (DECADRON) injection  10 mg Intravenous Q24H  . famotidine  20 mg Oral Daily  . fluticasone  2 spray Each Nare Daily  . insulin aspart  0-9 Units Subcutaneous TID WC  . Ipratropium-Albuterol  1 puff Inhalation Q6H  . omega-3 acid ethyl esters  1 g Oral Daily  . Warfarin - Pharmacist Dosing Inpatient   Does not apply q1800  . zinc sulfate  220 mg Oral Daily   Continuous Infusions: . sodium chloride Stopped (10/11/19 2004)  . remdesivir 100 mg in NS 100 mL 100 mg (10/13/19 0851)   PRN Meds:.sodium chloride, acetaminophen, albuterol, benzonatate, chlorpheniramine-HYDROcodone, guaiFENesin-dextromethorphan   Time Spent in minutes 35  See all Orders from today for further details   Lala Lund M.D on 10/13/2019 at 12:39 PM  To page go to [email or web address] - use universal password  [facility] -  Office  [phone]    Objective:   Vitals:   10/13/19 0633 10/13/19 0634 10/13/19 0748 10/13/19 0904  BP: 133/78  125/77   Pulse: 70 66 71   Resp: (!) 23 (!) '25 20 20  ' Temp:   (!) 97.3 F (36.3 C)   TempSrc:   Oral   SpO2: 90% 93% (!) 89%   Weight:      Height:  Wt Readings from Last 3 Encounters:  10/11/19 117.9 kg     Intake/Output Summary (Last 24 hours) at 10/13/2019 1239 Last data filed at 10/13/2019 0800 Gross per 24 hour  Intake 1040 ml  Output 500 ml  Net 540 ml     Physical Exam  Awake Alert, Oriented X 3, No new F.N deficits, Normal affect [place].AT,PERRAL Supple Neck,No JVD, No cervical lymphadenopathy appriciated.  Symmetrical Chest wall movement, Good air movement bilaterally, CTAB RRR,No Gallops, Rubs or new Murmurs, No Parasternal Heave +ve B.Sounds, Abd Soft, No tenderness, No organomegaly appriciated, No rebound - guarding or rigidity. No Cyanosis, Clubbing or edema, No new Rash or bruise    Data Review:    CBC Recent Labs  Lab 10/11/19 1745 10/12/19 0835  10/13/19 0304  WBC 8.7 6.0 8.8  HGB 15.9 15.3 13.5  HCT 46.8 45.7 41.0  PLT 399 376 367  MCV 90.3 90.0 91.3  MCH 30.7 30.1 30.1  MCHC 34.0 33.5 32.9  RDW 13.2 13.3 13.5  LYMPHSABS 0.8 0.5* 0.6*  MONOABS 0.4 0.1 0.2  EOSABS 0.0 0.0 0.0  BASOSABS 0.0 0.0 0.0    Chemistries  Recent Labs  Lab 10/11/19 1745 10/12/19 0835 10/13/19 0304  NA 139 141 138  K 3.9 4.0 4.0  CL 102 103 102  CO2 '24 25 26  ' GLUCOSE 140* 147* 142*  BUN 24* 27* 36*  CREATININE 1.02 1.00 0.88  CALCIUM 8.9 9.1 8.7*  AST 42* 40 33  ALT 37 39 35  ALKPHOS 63 63 53  BILITOT 0.9 0.6 1.0   ------------------------------------------------------------------------------------------------------------------ Recent Labs    10/11/19 1745  TRIG 145    Lab Results  Component Value Date   HGBA1C 5.7 (H) 10/12/2019   ------------------------------------------------------------------------------------------------------------------ No results for input(s): TSH, T4TOTAL, T3FREE, THYROIDAB in the last 72 hours.  Invalid input(s): FREET3 ------------------------------------------------------------------------------------------------------------------ Recent Labs    10/12/19 0835 10/13/19 0304  FERRITIN 898* 758*    Coagulation profile Recent Labs  Lab 10/12/19 0423 10/13/19 0304  INR 4.2* 3.5*    Recent Labs    10/12/19 0835 10/13/19 0304  DDIMER 0.94* 0.81*    Cardiac Enzymes No results for input(s): CKMB, TROPONINI, MYOGLOBIN in the last 168 hours.  Invalid input(s): CK ------------------------------------------------------------------------------------------------------------------    Component Value Date/Time   BNP 92.6 10/13/2019 0830    Micro Results Recent Results (from the past 240 hour(s))  Blood Culture (routine x 2)     Status: None (Preliminary result)   Collection Time: 10/11/19  5:45 PM   Specimen: BLOOD RIGHT ARM  Result Value Ref Range Status   Specimen Description    Final    BLOOD RIGHT ARM Performed at [facility], [address] [place], [place] [address]    Special Requests   Final    BOTTLES DRAWN AEROBIC AND ANAEROBIC Blood Culture adequate volume Performed at [facility], [address] [place], [place] [address]    Culture   Final    NO GROWTH 2 DAYS Performed at [facility], [place] [address] [place], [place] [address]    Report Status PENDING  Incomplete  SARS Coronavirus 2 Ag (30 min TAT) - Nasal Swab (BD Veritor Kit)     Status: None   Collection Time: 10/11/19  5:45 PM   Specimen: Nasal Swab (BD Veritor Kit)  Result Value Ref Range Status   SARS Coronavirus 2 Ag NEGATIVE NEGATIVE Final    Comment: (NOTE) SARS-CoV-2 antigen NOT  DETECTED.  Negative results are presumptive.  Negative results do not preclude SARS-CoV-2 infection and should not be used as the sole basis for treatment or other patient management decisions, including infection  control decisions, particularly in the presence of clinical signs and  symptoms consistent with COVID-19, or in those who have been in contact with the virus.  Negative results must be combined with clinical observations, patient history, and epidemiological information. The expected result is Negative. Fact Sheet for Patients: PodPark.tn Fact Sheet for Healthcare Providers: GiftContent.is This test is not yet approved or cleared by the [place] FDA and  has been authorized for detection and/or diagnosis of SARS-CoV-2 by FDA under an Emergency Use Authorization (EUA).  This EUA will remain in effect (meaning this test can be used) for the duration of  the COVID-19 de claration under Section 564(b)(1) of the Act, 21 U.S.C. section 360bbb-3(b)(1), unless the authorization is terminated or revoked sooner. Performed at [facility], [address] [place], [place] [address]   Blood Culture  (routine x 2)     Status: None (Preliminary result)   Collection Time: 10/11/19  5:53 PM   Specimen: BLOOD LEFT ARM  Result Value Ref Range Status   Specimen Description   Final    BLOOD LEFT ARM Performed at [facility], [address] [place], [place] [address]    Special Requests   Final    BOTTLES DRAWN AEROBIC AND ANAEROBIC Blood Culture adequate volume Performed at [facility], [address] [place], [place] [address]    Culture   Final    NO GROWTH 2 DAYS Performed at [facility], [place] [address], [place], [place] [address]    Report Status PENDING  Incomplete  Respiratory Panel by RT PCR (Flu A&B, Covid) - Nasopharyngeal Swab     Status: Abnormal   Collection Time: 10/11/19  6:00 PM   Specimen: Nasopharyngeal Swab  Result Value Ref Range Status   SARS Coronavirus 2 by RT PCR POSITIVE (A) NEGATIVE Final    Comment: RESULT CALLED TO, READ BACK BY AND VERIFIED WITH: K NEAL RN 10/11/19 22 01 JDW (NOTE) SARS-CoV-2 target nucleic acids are DETECTED. SARS-CoV-2 RNA is generally detectable in upper respiratory specimens  during the acute phase of infection. Positive results are indicative of the presence of the identified virus, but do not rule out bacterial infection or co-infection with other pathogens not detected by the test. Clinical correlation with patient history and other diagnostic information is necessary to determine patient infection status. The expected result is Negative. Fact Sheet for Patients:  PinkCheek.be Fact Sheet for Healthcare Providers: GravelBags.it This test is not yet approved or cleared by the [place] FDA and  has been authorized for detection and/or diagnosis of SARS-CoV-2 by FDA under an Emergency Use Authorization (EUA).  This EUA will remain in effect (meaning this test can be used) for the d uration of  the COVID-19 declaration under Section  564(b)(1) of the Act, 21 U.S.C. section 360bbb-3(b)(1), unless the authorization is terminated or revoked sooner.    Influenza A by PCR NEGATIVE NEGATIVE Final   Influenza B by PCR NEGATIVE NEGATIVE Final    Comment: (NOTE) The Xpert Xpress SARS-CoV-2/FLU/RSV assay is intended as an aid in  the diagnosis of influenza from Nasopharyngeal swab specimens and  should not be used as a sole basis for treatment. Nasal washings and  aspirates are unacceptable for Xpert Xpress SARS-CoV-2/FLU/RSV  testing. Fact Sheet for Patients: PinkCheek.be Fact Sheet for Healthcare Providers: GravelBags.it This test is not yet approved or cleared by the [place] FDA and  has been authorized for detection and/or diagnosis of SARS-CoV-2 by  FDA under an Emergency Use Authorization (EUA). This EUA will remain  in effect (meaning this test can be used) for the duration of the  Covid-19 declaration under Section 564(b)(1) of the Act, 21  U.S.C. section 360bbb-3(b)(1), unless the authorization is  terminated or revoked. Performed at [facility], [place] [address] [place], [place] [address]   SARS Coronavirus 2 by RT PCR (hospital order, performed in [facility] [place] [facility] hospital lab) Nasopharyngeal Nasopharyngeal Swab     Status: Abnormal   Collection Time: 10/11/19  7:59 PM   Specimen: Nasopharyngeal Swab  Result Value Ref Range Status   SARS Coronavirus 2 POSITIVE (A) NEGATIVE Final    Comment: RESULT CALLED TO, READ BACK BY AND VERIFIED WITHJosph Macho RN [phone] [id number] PHILLIPS C Performed at [facility], [address], [place], [place] [address]     Radiology Reports DG Chest [place] 1 View  Result Date: 10/11/2019 CLINICAL DATA:  Shortness of breath EXAM: PORTABLE CHEST 1 VIEW COMPARISON:  10/07/2019 FINDINGS: Interim development of bilateral left greater than right interstitial and ground-glass opacity, largely peripheral on the left.  No pleural effusion. Mild cardiomegaly. No pneumothorax. IMPRESSION: Interim development of left greater than right interstitial and ground-glass opacity, suspicious for bilateral pneumonia, possible atypical or viral pneumonia. Electronically Signed   By: Donavan Foil M.D.   On: 10/11/2019 18:22

## 2019-10-13 NOTE — Progress Notes (Signed)
Patient stood up on side of the bed to use urinal. Desat to 76% on 10LHFNC.  Was able to recover to 90% within about 3 minutes being reminded to breath through nose. If not breathing through nose.

## 2019-10-14 LAB — COMPREHENSIVE METABOLIC PANEL
ALT: 36 U/L (ref 0–44)
AST: 32 U/L (ref 15–41)
Albumin: 2.8 g/dL — ABNORMAL LOW (ref 3.5–5.0)
Alkaline Phosphatase: 52 U/L (ref 38–126)
Anion gap: 9 (ref 5–15)
BUN: 44 mg/dL — ABNORMAL HIGH (ref 8–23)
CO2: 25 mmol/L (ref 22–32)
Calcium: 8.6 mg/dL — ABNORMAL LOW (ref 8.9–10.3)
Chloride: 105 mmol/L (ref 98–111)
Creatinine, Ser: 0.95 mg/dL (ref 0.61–1.24)
GFR calc Af Amer: >60 mL/min (ref >60)
GFR calc non Af Amer: >60 mL/min (ref >60)
Glucose, Bld: 133 mg/dL — ABNORMAL HIGH (ref 70–99)
Potassium: 4.2 mmol/L (ref 3.5–5.1)
Sodium: 139 mmol/L (ref 135–145)
Total Bilirubin: 0.8 mg/dL (ref 0.3–1.2)
Total Protein: 6.2 g/dL — ABNORMAL LOW (ref 6.5–8.1)

## 2019-10-14 LAB — CBC WITH DIFFERENTIAL/PLATELET
Abs Immature Granulocytes: 0.08 10*3/uL — ABNORMAL HIGH (ref 0.00–0.07)
Basophils Absolute: 0 10*3/uL (ref 0.0–0.1)
Basophils Relative: 0 %
Eosinophils Absolute: 0 10*3/uL (ref 0.0–0.5)
Eosinophils Relative: 0 %
HCT: 40.6 % (ref 39.0–52.0)
Hemoglobin: 13.5 g/dL (ref 13.0–17.0)
Immature Granulocytes: 1 %
Lymphocytes Relative: 6 %
Lymphs Abs: 0.7 10*3/uL (ref 0.7–4.0)
MCH: 30.3 pg (ref 26.0–34.0)
MCHC: 33.3 g/dL (ref 30.0–36.0)
MCV: 91 fL (ref 80.0–100.0)
Monocytes Absolute: 0.3 10*3/uL (ref 0.1–1.0)
Monocytes Relative: 3 %
Neutro Abs: 9.7 10*3/uL — ABNORMAL HIGH (ref 1.7–7.7)
Neutrophils Relative %: 90 %
Platelets: 326 10*3/uL (ref 150–400)
RBC: 4.46 MIL/uL (ref 4.22–5.81)
RDW: 13.5 % (ref 11.5–15.5)
WBC: 10.8 10*3/uL — ABNORMAL HIGH (ref 4.0–10.5)
nRBC: 0 % (ref 0.0–0.2)

## 2019-10-14 LAB — PROTIME-INR
INR: 2.9 — ABNORMAL HIGH (ref 0.8–1.2)
Prothrombin Time: 30 seconds — ABNORMAL HIGH (ref 11.4–15.2)

## 2019-10-14 LAB — BRAIN NATRIURETIC PEPTIDE: B Natriuretic Peptide: 96.5 pg/mL (ref 0.0–100.0)

## 2019-10-14 LAB — GLUCOSE, CAPILLARY
Glucose-Capillary: 124 mg/dL — ABNORMAL HIGH (ref 70–99)
Glucose-Capillary: 222 mg/dL — ABNORMAL HIGH (ref 70–99)
Glucose-Capillary: 95 mg/dL (ref 70–99)
Glucose-Capillary: 187 mg/dL — ABNORMAL HIGH (ref 70–99)

## 2019-10-14 LAB — D-DIMER, QUANTITATIVE: D-Dimer, Quant: 3.12 ug/mL-FEU — ABNORMAL HIGH (ref 0.00–0.50)

## 2019-10-14 LAB — MAGNESIUM: Magnesium: 2.5 mg/dL — ABNORMAL HIGH (ref 1.7–2.4)

## 2019-10-14 LAB — C-REACTIVE PROTEIN: CRP: 2.4 mg/dL — ABNORMAL HIGH (ref <1.0)

## 2019-10-14 MED ORDER — FUROSEMIDE 10 MG/ML IJ SOLN
40.0000 mg | Freq: Once | INTRAMUSCULAR | Status: AC
  Administered 2019-10-14: 40 mg via INTRAVENOUS
  Filled 2019-10-14: qty 4

## 2019-10-14 MED ORDER — WARFARIN SODIUM 2.5 MG PO TABS
2.5000 mg | ORAL_TABLET | Freq: Once | ORAL | Status: AC
  Administered 2019-10-14: 2.5 mg via ORAL
  Filled 2019-10-14: qty 1

## 2019-10-14 NOTE — Progress Notes (Signed)
PROGRESS NOTE                                                                                                                                                                                                             Patient Demographics:    Bruce Stanley, is a 74 y.o. male, DOB - 1946-01-09, JHE:[id number]  Outpatient Primary MD for the patient is Patient, No Pcp Per   Admit date - 10/11/2019   LOS - 3  Chief Complaint  Patient presents with  . Shortness of Breath       Brief Narrative: Patient is a 74 y.o. 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  See below for further details.   Subjective:   Patient in bed, appears comfortable, denies any headache, no fever, no chest pain or pressure, improved shortness of breath , no abdominal pain. No focal weakness.    Assessment  & Plan :   Acute Hypoxic Resp Failure due to Covid 19 Viral pneumonia: He had severe disease with extreme hypoxia, was treated with IV steroids, remdesivir along with Actemra, excellent response, now down to 4-6 L nasal cannula oxygen at rest and is symptom-free.  Will continue to monitor.  Encouraged to sit up in chair and daytime use flutter valve and I-S for pulmonary toiletry and prone in bed at night.  He has developed a few Rales on exam on 10/14/2019 hence he will be given a trial of IV Lasix.    O2 requirements:  SpO2: 91 % O2 Flow Rate (L/min): 4 L/min   COVID-19 Labs: Recent Labs    10/11/19 1745 10/11/19 1745 10/12/19 0835 10/13/19 0304 10/14/19 0348  DDIMER 0.64*   < > 0.94* 0.81* 3.12*  FERRITIN 765*  --  898* 758*  --   LDH 568*  --   --   --   --   CRP 16.5*   < > 14.8* 6.8* 2.4*   < > = values in this  interval not displayed.       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 Yes; Fluid consistency: Thin  Diet effective now               Disposition Plan  :  Remain hospitalized  Barriers to discharge: Hypoxia requiring O2 supplementation/complete 5 days of IV Remdesivir  Antimicorbials  :    Anti-infectives (From admission, onward)   Start     Dose/Rate Route Frequency Ordered Stop   10/12/19 1000  remdesivir 100 mg in sodium chloride 0.9 % 100 mL IVPB     100 mg 200 mL/hr over 30 Minutes Intravenous Daily 10/11/19 2209 10/16/19 0959   10/11/19 2215  remdesivir 100 mg in sodium chloride 0.9 % 100 mL IVPB     100 mg 200 mL/hr over 30 Minutes Intravenous Every 30 min 10/11/19 2209 10/12/19 0010   10/11/19 1900  cefTRIAXone (ROCEPHIN) 1 g in sodium chloride 0.9 % 100 mL IVPB     1 g 200 mL/hr over 30 Minutes Intravenous  Once 10/11/19 1857 10/11/19 2004   10/11/19 1900  azithromycin (ZITHROMAX) 500 mg in sodium chloride 0.9 % 250 mL IVPB     500 mg 250 mL/hr over 60 Minutes Intravenous  Once  10/11/19 1857 10/11/19 2100     DVT Prophylaxis  : Coumadin  Lab Results  Component Value Date   INR 2.9 (H) 10/14/2019   INR 3.5 (H) 10/13/2019   INR 4.2 (HH) 10/12/2019    Inpatient Medications  Scheduled Meds: . vitamin C  500 mg Oral Daily  . atorvastatin  40 mg Oral q1800  . dexamethasone (DECADRON) injection  10 mg Intravenous Q24H  . famotidine  20 mg Oral Daily  . fluticasone  2 spray Each Nare Daily  . insulin aspart  0-9 Units Subcutaneous TID WC  . Ipratropium-Albuterol  1 puff Inhalation Q6H  . omega-3 acid ethyl esters  1 g Oral Daily  . warfarin  2.5 mg Oral ONCE-1800  . Warfarin - Pharmacist Dosing Inpatient   Does not apply q1800  . zinc sulfate  220 mg Oral Daily   Continuous Infusions: . sodium chloride Stopped (10/11/19 2004)  . remdesivir 100 mg in NS 100 mL 100 mg (10/14/19 0915)   PRN Meds:.sodium chloride, acetaminophen, albuterol, benzonatate, chlorpheniramine-HYDROcodone, guaiFENesin-dextromethorphan   Time Spent in minutes 35  See all Orders from today for further details   Lala Lund M.D on 10/14/2019 at 11:27 AM  To page go to [email or web address] - use universal password  [facility] -  Office  [phone]    Objective:   Vitals:   10/14/19 0000 10/14/19 0407 10/14/19 0721 10/14/19 0943  BP: 115/64 122/67 123/68   Pulse: (!) 57 69    Resp: '20 20 16 ' (!) 22  Temp: 98 F (36.7 C) 97.9 F (36.6 C) 98.2 F (36.8 C)   TempSrc:   Oral   SpO2: 93% 93%  91%  Weight:      Height:        Wt Readings from Last 3 Encounters:  10/11/19 117.9 kg     Intake/Output Summary (Last 24 hours) at 10/14/2019 1127 Last data filed at 10/14/2019 1100 Gross per 24 hour  Intake 595 ml  Output 1325 ml  Net -730 ml     Physical Exam  Awake Alert,  No new F.N deficits, Normal affect [place].AT,PERRAL Supple Neck,No JVD, No cervical lymphadenopathy appriciated.  Symmetrical Chest wall movement, Good air movement bilaterally, few rales RRR,No  Gallops, Rubs or new Murmurs, No Parasternal Heave +ve B.Sounds, Abd Soft, No tenderness, No organomegaly appriciated, No rebound - guarding or rigidity. No Cyanosis, Clubbing or edema, No new Rash or bruise     Data Review:    CBC Recent Labs  Lab 10/11/19 1745 10/12/19 0835 10/13/19 0304 10/14/19 0348  WBC 8.7 6.0 8.8 10.8*  HGB 15.9 15.3 13.5 13.5  HCT 46.8 45.7 41.0 40.6  PLT 399 376 367 326  MCV 90.3 90.0 91.3 91.0  MCH 30.7 30.1 30.1 30.3  MCHC 34.0 33.5 32.9 33.3  RDW 13.2 13.3 13.5 13.5  LYMPHSABS 0.8 0.5* 0.6* 0.7  MONOABS 0.4 0.1 0.2 0.3  EOSABS 0.0 0.0 0.0 0.0  BASOSABS 0.0 0.0 0.0 0.0    Chemistries  Recent Labs  Lab 10/11/19 1745 10/12/19 0835 10/13/19 0304 10/14/19 0348  NA 139 141 138 139  K 3.9 4.0 4.0 4.2  CL 102 103 102 105  CO2 '24 25 26 25  ' GLUCOSE 140* 147* 142* 133*  BUN 24* 27* 36* 44*  CREATININE 1.02 1.00 0.88 0.95  CALCIUM 8.9 9.1 8.7* 8.6*  MG  --   --   --  2.5*  AST 42* 40 33 32  ALT 37 39 35 36  ALKPHOS 63 63 53 52  BILITOT 0.9 0.6 1.0 0.8   ------------------------------------------------------------------------------------------------------------------ Recent Labs    10/11/19 1745  TRIG 145    Lab Results  Component Value Date   HGBA1C 5.7 (H) 10/12/2019   ------------------------------------------------------------------------------------------------------------------ No results for input(s): TSH, T4TOTAL, T3FREE, THYROIDAB in the last 72 hours.  Invalid input(s): FREET3 ------------------------------------------------------------------------------------------------------------------ Recent Labs    10/12/19 0835 10/13/19 0304  FERRITIN 898* 758*    Coagulation profile Recent Labs  Lab 10/12/19 0423 10/13/19 0304 10/14/19 0348  INR 4.2* 3.5* 2.9*    Recent Labs    10/13/19 0304 10/14/19 0348  DDIMER 0.81* 3.12*    Cardiac Enzymes No results for input(s): CKMB, TROPONINI, MYOGLOBIN in the last 168  hours.  Invalid input(s): CK ------------------------------------------------------------------------------------------------------------------    Component Value Date/Time   BNP 96.5 10/14/2019 0348    Micro Results Recent Results (from the past 240 hour(s))  Blood Culture (routine x 2)     Status: None (Preliminary result)   Collection Time: 10/11/19  5:45 PM   Specimen: BLOOD RIGHT ARM  Result Value Ref Range Status   Specimen Description   Final    BLOOD RIGHT ARM Performed at [facility], [address] [place], [place] [address]  Special Requests   Final    BOTTLES DRAWN AEROBIC AND ANAEROBIC Blood Culture adequate volume Performed at [facility], [address] [place], [place] [address]    Culture   Final    NO GROWTH 3 DAYS Performed at [facility], [place] [address], [place], [place] [address]    Report Status PENDING  Incomplete  SARS Coronavirus 2 Ag (30 min TAT) - Nasal Swab (BD Veritor Kit)     Status: None   Collection Time: 10/11/19  5:45 PM   Specimen: Nasal Swab (BD Veritor Kit)  Result Value Ref Range Status   SARS Coronavirus 2 Ag NEGATIVE NEGATIVE Final    Comment: (NOTE) SARS-CoV-2 antigen NOT DETECTED.  Negative results are presumptive.  Negative results do not preclude SARS-CoV-2 infection and should not be used as the sole basis for treatment or other patient management decisions, including infection  control decisions, particularly in the presence of clinical signs and  symptoms consistent with COVID-19, or in those who have been in contact with the virus.  Negative results must be combined with clinical observations, patient history, and epidemiological information. The expected result is Negative. Fact Sheet for Patients: PodPark.tn Fact Sheet for Healthcare Providers: GiftContent.is This test is not yet approved or cleared by the [place] FDA and    has been authorized for detection and/or diagnosis of SARS-CoV-2 by FDA under an Emergency Use Authorization (EUA).  This EUA will remain in effect (meaning this test can be used) for the duration of  the COVID-19 de claration under Section 564(b)(1) of the Act, 21 U.S.C. section 360bbb-3(b)(1), unless the authorization is terminated or revoked sooner. Performed at [facility], [address] [place], [place] [address]   Blood Culture (routine x 2)     Status: None (Preliminary result)   Collection Time: 10/11/19  5:53 PM   Specimen: BLOOD LEFT ARM  Result Value Ref Range Status   Specimen Description   Final    BLOOD LEFT ARM Performed at [facility], [address] [place], [place] [address]    Special Requests   Final    BOTTLES DRAWN AEROBIC AND ANAEROBIC Blood Culture adequate volume Performed at [facility], [address] [place], [place] [address]    Culture   Final    NO GROWTH 3 DAYS Performed at [facility], [place] [address], [place], [place] [address]    Report Status PENDING  Incomplete  Respiratory Panel by RT PCR (Flu A&B, Covid) - Nasopharyngeal Swab     Status: Abnormal   Collection Time: 10/11/19  6:00 PM   Specimen: Nasopharyngeal Swab  Result Value Ref Range Status   SARS Coronavirus 2 by RT PCR POSITIVE (A) NEGATIVE Final    Comment: RESULT CALLED TO, READ BACK BY AND VERIFIED WITH: K NEAL RN 10/11/19 22 01 JDW (NOTE) SARS-CoV-2 target nucleic acids are DETECTED. SARS-CoV-2 RNA is generally detectable in upper respiratory specimens  during the acute phase of infection. Positive results are indicative of the presence of the identified virus, but do not rule out bacterial infection or co-infection with other pathogens not detected by the test. Clinical correlation with patient history and other diagnostic information is necessary to determine patient infection status. The expected result is Negative. Fact Sheet  for Patients:  PinkCheek.be Fact Sheet for Healthcare Providers: GravelBags.it This test is not yet approved or cleared by the [place] FDA  and  has been authorized for detection and/or diagnosis of SARS-CoV-2 by FDA under an Emergency Use Authorization (EUA).  This EUA will remain in effect (meaning this test can be used) for the d uration of  the COVID-19 declaration under Section 564(b)(1) of the Act, 21 U.S.C. section 360bbb-3(b)(1), unless the authorization is terminated or revoked sooner.    Influenza A by PCR NEGATIVE NEGATIVE Final   Influenza B by PCR NEGATIVE NEGATIVE Final    Comment: (NOTE) The Xpert Xpress SARS-CoV-2/FLU/RSV assay is intended as an aid in  the diagnosis of influenza from Nasopharyngeal swab specimens and  should not be used as a sole basis for treatment. Nasal washings and  aspirates are unacceptable for Xpert Xpress SARS-CoV-2/FLU/RSV  testing. Fact Sheet for Patients: PinkCheek.be Fact Sheet for Healthcare Providers: GravelBags.it This test is not yet approved or cleared by the [place] FDA and  has been authorized for detection and/or diagnosis of SARS-CoV-2 by  FDA under an Emergency Use Authorization (EUA). This EUA will remain  in effect (meaning this test can be used) for the duration of the  Covid-19 declaration under Section 564(b)(1) of the Act, 21  U.S.C. section 360bbb-3(b)(1), unless the authorization is  terminated or revoked. Performed at [facility], [place] [address], [place], [place] [address]   SARS Coronavirus 2 by RT PCR (hospital order, performed in [facility] hospital lab) Nasopharyngeal Nasopharyngeal Swab     Status: Abnormal   Collection Time: 10/11/19  7:59 PM   Specimen: Nasopharyngeal Swab  Result Value Ref Range Status   SARS Coronavirus 2 POSITIVE (A) NEGATIVE Final    Comment: RESULT CALLED  TO, READ BACK BY AND VERIFIED WITHJosph Macho RN [phone] [id number] PHILLIPS C Performed at [facility], [address], [place], [place] [address]     Radiology Reports DG Chest [place] 1 View  Result Date: 10/11/2019 CLINICAL DATA:  Shortness of breath EXAM: PORTABLE CHEST 1 VIEW COMPARISON:  10/07/2019 FINDINGS: Interim development of bilateral left greater than right interstitial and ground-glass opacity, largely peripheral on the left. No pleural effusion. Mild cardiomegaly. No pneumothorax. IMPRESSION: Interim development of left greater than right interstitial and ground-glass opacity, suspicious for bilateral pneumonia, possible atypical or viral pneumonia. Electronically Signed   By: Donavan Foil M.D.   On: 10/11/2019 18:22

## 2019-10-14 NOTE — Progress Notes (Signed)
Physical Therapy Treatment Patient Details Name: Bruce Stanley MRN: [id number] DOB: 07-Jul-1946 Today's Date: 10/14/2019    History of Present Illness 74 y/o male w/ hx of DVT/PE on coumadin, blood clots, OSA w/ CPAP dependence, presented to ED with SOB and cough. Sx started 1/5 but pt had tested _ for COVID. Pt takes care of spouse at home w/ caregiver coming in once a week    PT Comments    Very pleasant and motivated male patient. He said he "was so happy to be walking more now". Able to ambulate 85 feet - 4 LPM, [place], and monitored throughtout- close to end of walk, desatted to 88%, but recovered in 4 minutes to > 93%. 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 Should continue to benefit from PT to further address goals for optimal functional outcomes.   Follow Up Recommendations  No PT follow up     Equipment Recommendations  None recommended by PT    Recommendations for Other Services       Precautions / Restrictions Precautions Precautions: Other (comment) Precaution Comments: Monitor O2 SATs, desats quickly Restrictions Weight Bearing Restrictions: No Other Position/Activity Restrictions: Very pleasant and able to walk 85 feet today- O2 sats fluctuated between 88-91 % during gait with no AD.    Mobility  Bed Mobility Overal bed mobility: Modified Independent                Transfers Overall transfer level: Needs assistance(Close Supervision) Equipment used: None Transfers: Sit to/from [employer] Sit to Stand: Supervision Stand pivot transfers: Supervision          Ambulation/Gait Ambulation/Gait assistance: Supervision Gait Distance (Feet): 85 Feet(See O2 Sats- monitored throughout ambulation) Assistive device: None Gait Pattern/deviations: Step-through pattern Gait velocity: fair   General Gait Details: 4  LPM/[place]   Stairs             Wheelchair Mobility    Modified Rankin (Stroke Patients Only)       Balance Overall balance assessment: Mild deficits observed, not formally tested                                          Cognition Arousal/Alertness: Awake/alert Behavior During Therapy: WFL for tasks assessed/performed Overall Cognitive Status: Within Functional Limits for tasks assessed                                 General Comments: States that he was worried about his wife and she is disabled primarily w/c bound- and because he did all the organization of household expenses etc, she calls him frequently with questions.      Exercises Other Exercises Other Exercises: Incentive spirometer - achieviing 1250+ 8 out of 10 reps. Good return demo with FLUTTER and using hourly.    General Comments General comments (skin integrity, edema, etc.): Fair tone and turgor, no edema noted.      Pertinent Vitals/Pain Pain Assessment: No/denies pain    Home Living                      Prior Function            PT Goals (current goals can now be  found in the care plan section) Acute Rehab PT Goals Patient Stated Goal: to go home PT Goal Formulation: With patient Time For Goal Achievement: 10/26/19 Potential to Achieve Goals: Good Progress towards PT goals: Progressing toward goals    Frequency    Min 3X/week      PT Plan      Co-evaluation              AM-PAC PT "6 Clicks" Mobility   Outcome Measure  Help needed turning from your back to your side while in a flat bed without using bedrails?: None Help needed moving from lying on your back to sitting on the side of a flat bed without using bedrails?: None Help needed moving to and from a bed to a chair (including a wheelchair)?: A Little Help needed standing up from a chair using your arms (e.g., wheelchair or bedside chair)?: A Little Help needed to walk in  hospital room?: None Help needed climbing 3-5 steps with a railing? : A Little 6 Click Score: 21    End of Session Equipment Utilized During Treatment: Oxygen Activity Tolerance: Patient tolerated treatment well Patient left: in chair;with call bell/phone within reach Nurse Communication: Mobility status PT Visit Diagnosis: Muscle weakness (generalized) (M62.81)     Time: 0150-0224 PT Time Calculation (min) (ACUTE ONLY): 34 min  Charges:  $Gait Training: 8-22 mins $Therapeutic Exercise: 8-22 mins                    Rollen Sox, PT # [phone] CGV cell    Casandra Doffing 10/14/2019, 3:53 PM

## 2019-10-14 NOTE — Progress Notes (Signed)
ANTICOAGULATION CONSULT NOTE  Pharmacy Consult for warfarin dosing Indication:  Hx of DVT/PE, lifelong anti-coagulation  Allergies  Allergen Reactions  . Iodine Itching    Patient Measurements: Height: 6' (182.9 cm) Weight: 260 lb (117.9 kg) IBW/kg (Calculated) : 77.6 Heparin Dosing Weight: HEPARIN DW (KG): 103.3  Vital Signs: Temp: 98.2 F (36.8 C) (01/18 0721) Temp Source: Oral (01/18 0721) BP: 123/68 (01/18 0721) Pulse Rate: 69 (01/18 0407)  Labs: Recent Labs    10/11/19 1745 10/11/19 1745 10/11/19 1951 10/12/19 0423 10/12/19 0835 10/12/19 0835 10/13/19 0304 10/14/19 0348  HGB 15.9   < >  --   --  15.3   < > 13.5 13.5  HCT 46.8   < >  --   --  45.7  --  41.0 40.6  PLT 399   < >  --   --  376  --  367 326  LABPROT  --   --   --  40.6*  --   --  34.7* 30.0*  INR  --   --   --  4.2*  --   --  3.5* 2.9*  CREATININE 1.02   < >  --   --  1.00  --  0.88 0.95  TROPONINIHS 9  --  7  --   --   --   --   --    < > = values in this interval not displayed.    Estimated Creatinine Clearance: 90.4 mL/min (by C-G formula based on SCr of 0.95 mg/dL).  Medical History: Past Medical History:  Diagnosis Date  . CPAP (continuous positive airway pressure) dependence   . H/O blood clots   . PE (pulmonary thromboembolism) (HCC)      Assessment:  Pharmacy consulted to dose warfarin for this 74 yo male on chronic anti-coagulation with warfarin for history of DVT/PE.  INR supra-therapeutic at last Coumadin clinic 2/2 doxy, pred, dec vit K intake from illness.     Home dose: warfarin 5mg  daily Last dose:  warfarin 5mg  on 10/10/19   INR is trending down and now < 3, no bleeding reported, no drug interactions  Goal of Therapy:  INR 2-3 Monitor platelets by anticoagulation protocol: Yes   Plan:  Coumadin 2.5 mg tonight Daily PT/INR  Monitor for signs and symptoms of bleeding.  , PharmD, BCPS 10/14/2019 9:31 AM

## 2019-10-15 LAB — CBC WITH DIFFERENTIAL/PLATELET
Abs Immature Granulocytes: 0.18 10*3/uL — ABNORMAL HIGH (ref 0.00–0.07)
Basophils Absolute: 0 10*3/uL (ref 0.0–0.1)
Basophils Relative: 0 %
Eosinophils Absolute: 0 10*3/uL (ref 0.0–0.5)
Eosinophils Relative: 0 %
HCT: 42.8 % (ref 39.0–52.0)
Hemoglobin: 14.5 g/dL (ref 13.0–17.0)
Immature Granulocytes: 2 %
Lymphocytes Relative: 8 %
Lymphs Abs: 0.9 10*3/uL (ref 0.7–4.0)
MCH: 30.7 pg (ref 26.0–34.0)
MCHC: 33.9 g/dL (ref 30.0–36.0)
MCV: 90.5 fL (ref 80.0–100.0)
Monocytes Absolute: 0.3 10*3/uL (ref 0.1–1.0)
Monocytes Relative: 3 %
Neutro Abs: 9.6 10*3/uL — ABNORMAL HIGH (ref 1.7–7.7)
Neutrophils Relative %: 87 %
Platelets: 325 10*3/uL (ref 150–400)
RBC: 4.73 MIL/uL (ref 4.22–5.81)
RDW: 13.6 % (ref 11.5–15.5)
WBC: 11 10*3/uL — ABNORMAL HIGH (ref 4.0–10.5)
nRBC: 0.5 % — ABNORMAL HIGH (ref 0.0–0.2)

## 2019-10-15 LAB — COMPREHENSIVE METABOLIC PANEL
ALT: 41 U/L (ref 0–44)
AST: 31 U/L (ref 15–41)
Albumin: 2.8 g/dL — ABNORMAL LOW (ref 3.5–5.0)
Alkaline Phosphatase: 56 U/L (ref 38–126)
Anion gap: 10 (ref 5–15)
BUN: 47 mg/dL — ABNORMAL HIGH (ref 8–23)
CO2: 24 mmol/L (ref 22–32)
Calcium: 8.5 mg/dL — ABNORMAL LOW (ref 8.9–10.3)
Chloride: 105 mmol/L (ref 98–111)
Creatinine, Ser: 1.03 mg/dL (ref 0.61–1.24)
GFR calc Af Amer: >60 mL/min (ref >60)
GFR calc non Af Amer: >60 mL/min (ref >60)
Glucose, Bld: 141 mg/dL — ABNORMAL HIGH (ref 70–99)
Potassium: 4.5 mmol/L (ref 3.5–5.1)
Sodium: 139 mmol/L (ref 135–145)
Total Bilirubin: 0.8 mg/dL (ref 0.3–1.2)
Total Protein: 6 g/dL — ABNORMAL LOW (ref 6.5–8.1)

## 2019-10-15 LAB — C-REACTIVE PROTEIN: CRP: 1.1 mg/dL — ABNORMAL HIGH (ref <1.0)

## 2019-10-15 LAB — PROTIME-INR
INR: 2.7 — ABNORMAL HIGH (ref 0.8–1.2)
Prothrombin Time: 28.3 seconds — ABNORMAL HIGH (ref 11.4–15.2)

## 2019-10-15 LAB — GLUCOSE, CAPILLARY
Glucose-Capillary: 125 mg/dL — ABNORMAL HIGH (ref 70–99)
Glucose-Capillary: 155 mg/dL — ABNORMAL HIGH (ref 70–99)
Glucose-Capillary: 225 mg/dL — ABNORMAL HIGH (ref 70–99)
Glucose-Capillary: 140 mg/dL — ABNORMAL HIGH (ref 70–99)

## 2019-10-15 LAB — D-DIMER, QUANTITATIVE: D-Dimer, Quant: 3.25 ug/mL-FEU — ABNORMAL HIGH (ref 0.00–0.50)

## 2019-10-15 LAB — BRAIN NATRIURETIC PEPTIDE: B Natriuretic Peptide: 78.8 pg/mL (ref 0.0–100.0)

## 2019-10-15 LAB — MAGNESIUM: Magnesium: 2.3 mg/dL (ref 1.7–2.4)

## 2019-10-15 MED ORDER — WARFARIN SODIUM 5 MG PO TABS
5.0000 mg | ORAL_TABLET | Freq: Once | ORAL | Status: AC
  Administered 2019-10-15: 5 mg via ORAL
  Filled 2019-10-15: qty 1

## 2019-10-15 NOTE — Progress Notes (Signed)
Occupational Therapy Treatment Patient Details Name: Bruce Stanley MRN: [id number] DOB: [birth year] Today's Date: 10/15/2019    History of present illness 74 y/o male w/ hx of DVT/PE on coumadin, blood clots, OSA w/ CPAP dependence, presented to ED with SOB and cough. Sx started 1/5 but pt had tested _ for COVID. Pt takes care of spouse at home w/ caregiver coming in once a week   OT comments  Pt continues to make progress in therapy, demonstrating increased activity tolerance as compared to previous session. Pt on 3L [place] with SpO2 96% at rest. Pt tolerated standing and ambulating 1 x 3 min and 1 x 4 min with 3XT and supervision. Pt on 6L [place] during mobility with SpO2 decreasing to mid 70s. Pt required ~3-4 min seated recovery to return to 90s after first walk and required ~6-7 min seated recovery following second walk. Pt reported mod shortness of breath during mobility. Continued education with pt on breathing exercises and techniques with good understanding and follow through. Continued education with pt on safety strategies and activity pacing techniques with fair understanding. Pt unable to recall previously learned energy conservation techniques or fall prevention strategies with continued education provided. OT will continue to follow acutely.     Follow Up Recommendations  No OT follow up;Supervision - Intermittent    Equipment Recommendations  None recommended by OT    Recommendations for Other Services      Precautions / Restrictions Precautions Precautions: Other (comment) Precaution Comments: Monitor O2 SATs, desats quickly Restrictions Weight Bearing Restrictions: No       Mobility Bed Mobility               General bed mobility comments: Pt seated in bedside chair upon OT arrival.  Transfers Overall transfer level: Needs assistance Equipment used: 4-wheeled walker Transfers: Sit to/from Stand;Stand Pivot Transfers Sit to Stand: Supervision Stand pivot transfers:  Supervision       General transfer comment: Cues for safety    Balance Overall balance assessment: Mild deficits observed, not formally tested                                         ADL either performed or assessed with clinical judgement   ADL                                               Vision       Perception     Praxis      Cognition Arousal/Alertness: Awake/alert Behavior During Therapy: WFL for tasks assessed/performed Overall Cognitive Status: Within Functional Limits for tasks assessed                                          Exercises Exercises: Other exercises Other Exercises Other Exercises: Incentive spirometer x 10. Pulling 1230mL.   Shoulder Instructions       General Comments Pt on 3L [place] with SpO2 96% at rest. Pt tolerated standing and ambulating 1 x 3 min and 1 x 4 min with 0GY and supervision. SpO2 decreased to mid 70s on 6L .     Pertinent Vitals/ Pain  Pain Assessment: No/denies pain  Home Living                                          Prior Functioning/Environment              Frequency           Progress Toward Goals  OT Goals(current goals can now be found in the care plan section)  Progress towards OT goals: Progressing toward goals  ADL Goals Pt Will Perform Grooming: Independently;standing Pt Will Perform Lower Body Bathing: with modified independence;sit to/from stand Pt Will Perform Lower Body Dressing: with modified independence;sit to/from stand Pt Will Transfer to Toilet: Independently;ambulating;regular height toilet Pt Will Perform Toileting - Clothing Manipulation and hygiene: Independently;sit to/from stand Additional ADL Goal #1: Pt to recall and verbalize 3 energy conservation strategies with 0 verbal cues. Additional ADL Goal #2: Pt to recall and verbalize 3 fall prevention strategies with 0 verbal cues. Additional ADL Goal #3:  Pt to tolerate standing up to 10 min independently with SpO2 maintaining in 90s, in preparation for ADLs.  Plan Discharge plan remains appropriate    Co-evaluation                 AM-PAC OT "6 Clicks" Daily Activity     Outcome Measure   Help from another person eating meals?: None Help from another person taking care of personal grooming?: A Little Help from another person toileting, which includes using toliet, bedpan, or urinal?: A Little Help from another person bathing (including washing, rinsing, drying)?: A Little Help from another person to put on and taking off regular upper body clothing?: A Little Help from another person to put on and taking off regular lower body clothing?: A Little 6 Click Score: 19    End of Session Equipment Utilized During Treatment: Rolling walker;Oxygen  OT Visit Diagnosis: Unsteadiness on feet (R26.81);Muscle weakness (generalized) (M62.81)   Activity Tolerance Patient limited by fatigue(Limited by SOB)   Patient Left in chair;with call bell/phone within reach   Nurse Communication Mobility status        Time: 1132-1209 OT Time Calculation (min): 37 min  Charges: OT General Charges $OT Visit: 1 Visit OT Treatments $Therapeutic Activity: 23-37 mins  Peterson Ao OTR/L [phone]    Peterson Ao 10/15/2019, 1:14 PM

## 2019-10-15 NOTE — Progress Notes (Signed)
PROGRESS NOTE                                                                                                                                                                                                             Patient Demographics:    Bruce Stanley, is a 74 y.o. male, DOB - 07-Aug-1946, HBZ:[id number]  Outpatient Primary MD for the patient is Patient, No Pcp Per   Admit date - 10/11/2019   LOS - 4  Chief Complaint  Patient presents with  . Shortness of Breath       Brief Narrative: Patient is a 74 y.o. 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  See below for further details.   Subjective:   Patient in bed, appears comfortable, denies any headache, no fever, no chest pain or pressure, no shortness of breath , no abdominal pain. No focal weakness.   Assessment  & Plan :   Acute Hypoxic Resp Failure due to Covid 19 Viral pneumonia: He had severe disease with extreme hypoxia, was treated with IV steroids, remdesivir along with Actemra, excellent response, now down to 4-6 L nasal cannula oxygen at rest and is symptom-free.  Will continue to monitor.  Encouraged to sit up in chair and daytime use flutter valve and I-S for pulmonary toiletry and prone in bed at night.  Rales resolved post Lasix on 1/18.    O2 requirements:  SpO2: 90 % O2 Flow Rate (L/min): 1 L/min   COVID-19 Labs: Recent Labs    10/13/19 0304 10/14/19 0348 10/15/19 0145  DDIMER 0.81* 3.12* 3.25*  FERRITIN 758*  --   --   CRP 6.8* 2.4* 1.1*       Component Value Date/Time   BNP 78.8 10/15/2019 0145    Recent Labs  Lab 10/11/19 1745  PROCALCITON <0.10    Lab Results  Component Value Date   SARSCOV2NAA POSITIVE  (A) 10/11/2019   SARSCOV2NAA POSITIVE (A) 10/11/2019      Hepatic Function Latest Ref Rng & Units 10/15/2019 10/14/2019 10/13/2019  Total Protein 6.5 - 8.1 g/dL 6.0(L) 6.2(L) 6.5  Albumin 3.5 - 5.0 g/dL 2.8(L) 2.8(L) 2.9(L)  AST 15 - 41  U/L 31 32 33  ALT 0 - 44 U/L 41 36 35  Alk Phosphatase 38 - 126 U/L 56 52 53  Total Bilirubin 0.3 - 1.2 mg/dL 0.8 0.8 1.0     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 Yes; Fluid consistency: Thin  Diet effective now               Disposition Plan  :  Home in am  Barriers to discharge: Hypoxia requiring O2 supplementation/complete 5 days of IV Remdesivir  Antimicorbials  :    Anti-infectives (From admission, onward)   Start     Dose/Rate Route Frequency Ordered Stop   10/12/19 1000  remdesivir 100 mg in sodium chloride 0.9 % 100 mL IVPB     100 mg 200 mL/hr over 30 Minutes Intravenous Daily 10/11/19 2209 10/15/19 0954   10/11/19 2215  remdesivir 100 mg in sodium chloride 0.9 % 100 mL IVPB     100 mg 200 mL/hr over 30 Minutes Intravenous Every 30 min 10/11/19 2209 10/12/19 0010   10/11/19 1900  cefTRIAXone (ROCEPHIN) 1 g in sodium chloride 0.9 % 100 mL IVPB     1 g 200 mL/hr over 30 Minutes Intravenous  Once 10/11/19 1857 10/11/19 2004   10/11/19 1900  azithromycin (ZITHROMAX) 500 mg in sodium chloride 0.9 % 250 mL IVPB     500 mg 250 mL/hr over 60 Minutes Intravenous  Once 10/11/19 1857 10/11/19 2100     DVT Prophylaxis  : Coumadin  Lab Results  Component Value Date   INR 2.7 (H) 10/15/2019   INR 2.9 (H) 10/14/2019   INR 3.5 (H) 10/13/2019    Inpatient Medications  Scheduled  Meds: . vitamin C  500 mg Oral Daily  . atorvastatin  40 mg Oral q1800  . dexamethasone (DECADRON) injection  10 mg Intravenous Q24H  . famotidine  20 mg Oral Daily  . fluticasone  2 spray Each Nare Daily  . insulin aspart  0-9 Units Subcutaneous TID WC  . Ipratropium-Albuterol  1 puff Inhalation Q6H  . omega-3 acid ethyl esters  1 g Oral Daily  . warfarin  5 mg Oral ONCE-1800  . Warfarin - Pharmacist Dosing Inpatient   Does not apply q1800  . zinc sulfate  220 mg Oral Daily   Continuous Infusions: . sodium chloride Stopped (10/11/19 2004)   PRN Meds:.sodium chloride, acetaminophen, albuterol, benzonatate, chlorpheniramine-HYDROcodone, guaiFENesin-dextromethorphan   Time Spent in minutes 35  See all Orders from today for further details   Lala Lund M.D on 10/15/2019 at 10:01 AM  To page go to [email or web address] - use universal password  [facility] -  Office  [phone]    Objective:   Vitals:   10/14/19 1909 10/15/19 0005 10/15/19 0403 10/15/19 0724  BP:  127/83 122/74 126/75  Pulse: 83 (!) 59 61   Resp:  19 20   Temp:  97.9 F (36.6 C) 98 F (36.7 C) 97.8 F (36.6 C)  TempSrc:    Oral  SpO2: 90% 97% 92% 90%  Weight:  Height:        Wt Readings from Last 3 Encounters:  10/11/19 117.9 kg     Intake/Output Summary (Last 24 hours) at 10/15/2019 1001 Last data filed at 10/15/2019 0724 Gross per 24 hour  Intake 900 ml  Output 1850 ml  Net -950 ml     Physical Exam  Awake Alert,  No new F.N deficits, Normal affect [place].AT,PERRAL Supple Neck,No JVD, No cervical lymphadenopathy appriciated.  Symmetrical Chest wall movement, Good air movement bilaterally, CTAB RRR,No Gallops, Rubs or new Murmurs, No Parasternal Heave +ve B.Sounds, Abd Soft, No tenderness, No organomegaly appriciated, No rebound - guarding or rigidity. No Cyanosis, Clubbing or edema, No new Rash or bruise    Data Review:    CBC Recent Labs  Lab 10/11/19 1745 10/12/19 0835  10/13/19 0304 10/14/19 0348 10/15/19 0145  WBC 8.7 6.0 8.8 10.8* 11.0*  HGB 15.9 15.3 13.5 13.5 14.5  HCT 46.8 45.7 41.0 40.6 42.8  PLT 399 376 367 326 325  MCV 90.3 90.0 91.3 91.0 90.5  MCH 30.7 30.1 30.1 30.3 30.7  MCHC 34.0 33.5 32.9 33.3 33.9  RDW 13.2 13.3 13.5 13.5 13.6  LYMPHSABS 0.8 0.5* 0.6* 0.7 0.9  MONOABS 0.4 0.1 0.2 0.3 0.3  EOSABS 0.0 0.0 0.0 0.0 0.0  BASOSABS 0.0 0.0 0.0 0.0 0.0    Chemistries  Recent Labs  Lab 10/11/19 1745 10/12/19 0835 10/13/19 0304 10/14/19 0348 10/15/19 0145  NA 139 141 138 139 139  K 3.9 4.0 4.0 4.2 4.5  CL 102 103 102 105 105  CO2 _0 GLUCOSE 140* 147* 142* 133* 141*  BUN 24* 27* 36* 44* 47*  CREATININE 1.02 1.00 0.88 0.95 1.03  CALCIUM 8.9 9.1 8.7* 8.6* 8.5*  MG  --   --   --  2.5* 2.3  AST 42* 40 33 32 31  ALT 37 39 35 36 41  ALKPHOS 63 63 53 52 56  BILITOT 0.9 0.6 1.0 0.8 0.8   ------------------------------------------------------------------------------------------------------------------ No results for input(s): CHOL, HDL, LDLCALC, TRIG, CHOLHDL, LDLDIRECT in the last 72 hours.  Lab Results  Component Value Date   HGBA1C 5.7 (H) 10/12/2019   ------------------------------------------------------------------------------------------------------------------ No results for input(s): TSH, T4TOTAL, T3FREE, THYROIDAB in the last 72 hours.  Invalid input(s): FREET3 ------------------------------------------------------------------------------------------------------------------ Recent Labs    10/13/19 0304  FERRITIN 758*    Coagulation profile Recent Labs  Lab 10/12/19 0423 10/13/19 0304 10/14/19 0348 10/15/19 0145  INR 4.2* 3.5* 2.9* 2.7*    Recent Labs    10/14/19 0348 10/15/19 0145  DDIMER 3.12* 3.25*    Cardiac Enzymes No results for input(s): CKMB, TROPONINI, MYOGLOBIN in the last 168 hours.  Invalid input(s):  CK ------------------------------------------------------------------------------------------------------------------    Component Value Date/Time   BNP 78.8 10/15/2019 0145    Micro Results Recent Results (from the past 240 hour(s))  Blood Culture (routine x 2)     Status: None (Preliminary result)   Collection Time: 10/11/19  5:45 PM   Specimen: BLOOD RIGHT ARM  Result Value Ref Range Status   Specimen Description   Final    BLOOD RIGHT ARM Performed at [facility], [address] [place], [place] [address]    Special Requests   Final    BOTTLES DRAWN AEROBIC AND ANAEROBIC Blood Culture adequate volume Performed at [facility], [address] [place], [place] [address]    Culture   Final    NO GROWTH 4  DAYS Performed at [facility], [place] [address], [place], [place] [address]    Report Status PENDING  Incomplete  SARS Coronavirus 2 Ag (30 min TAT) - Nasal Swab (BD Veritor Kit)     Status: None   Collection Time: 10/11/19  5:45 PM   Specimen: Nasal Swab (BD Veritor Kit)  Result Value Ref Range Status   SARS Coronavirus 2 Ag NEGATIVE NEGATIVE Final    Comment: (NOTE) SARS-CoV-2 antigen NOT DETECTED.  Negative results are presumptive.  Negative results do not preclude SARS-CoV-2 infection and should not be used as the sole basis for treatment or other patient management decisions, including infection  control decisions, particularly in the presence of clinical signs and  symptoms consistent with COVID-19, or in those who have been in contact with the virus.  Negative results must be combined with clinical observations, patient history, and epidemiological information. The expected result is Negative. Fact Sheet for Patients: PodPark.tn Fact Sheet for Healthcare Providers: GiftContent.is This test is not yet approved or cleared by the [place] FDA and  has been authorized for  detection and/or diagnosis of SARS-CoV-2 by FDA under an Emergency Use Authorization (EUA).  This EUA will remain in effect (meaning this test can be used) for the duration of  the COVID-19 de claration under Section 564(b)(1) of the Act, 21 U.S.C. section 360bbb-3(b)(1), unless the authorization is terminated or revoked sooner. Performed at [facility], [address] [place], [place] [address]   Blood Culture (routine x 2)     Status: None (Preliminary result)   Collection Time: 10/11/19  5:53 PM   Specimen: BLOOD LEFT ARM  Result Value Ref Range Status   Specimen Description   Final    BLOOD LEFT ARM Performed at [facility], [address] [place], [place] [address]    Special Requests   Final    BOTTLES DRAWN AEROBIC AND ANAEROBIC Blood Culture adequate volume Performed at [facility], [address] [place], [place] [address]    Culture   Final    NO GROWTH 4 DAYS Performed at [facility], [place] [address], [place], [place] [address]    Report Status PENDING  Incomplete  Respiratory Panel by RT PCR (Flu A&B, Covid) - Nasopharyngeal Swab     Status: Abnormal   Collection Time: 10/11/19  6:00 PM   Specimen: Nasopharyngeal Swab  Result Value Ref Range Status   SARS Coronavirus 2 by RT PCR POSITIVE (A) NEGATIVE Final    Comment: RESULT CALLED TO, READ BACK BY AND VERIFIED WITH: K NEAL RN 10/11/19 22 01 JDW (NOTE) SARS-CoV-2 target nucleic acids are DETECTED. SARS-CoV-2 RNA is generally detectable in upper respiratory specimens  during the acute phase of infection. Positive results are indicative of the presence of the identified virus, but do not rule out bacterial infection or co-infection with other pathogens not detected by the test. Clinical correlation with patient history and other diagnostic information is necessary to determine patient infection status. The expected result is Negative. Fact Sheet for Patients:    PinkCheek.be Fact Sheet for Healthcare Providers: GravelBags.it This test is not yet approved or cleared by the [place] FDA and  has been authorized for detection and/or diagnosis of SARS-CoV-2 by FDA under an Emergency Use Authorization (EUA).  This EUA will remain in effect (meaning this test can be used) for the d uration of  the COVID-19 declaration under Section 564(b)(1)  of the Act, 21 U.S.C. section 360bbb-3(b)(1), unless the authorization is terminated or revoked sooner.    Influenza A by PCR NEGATIVE NEGATIVE Final   Influenza B by PCR NEGATIVE NEGATIVE Final    Comment: (NOTE) The Xpert Xpress SARS-CoV-2/FLU/RSV assay is intended as an aid in  the diagnosis of influenza from Nasopharyngeal swab specimens and  should not be used as a sole basis for treatment. Nasal washings and  aspirates are unacceptable for Xpert Xpress SARS-CoV-2/FLU/RSV  testing. Fact Sheet for Patients: PinkCheek.be Fact Sheet for Healthcare Providers: GravelBags.it This test is not yet approved or cleared by the [place] FDA and  has been authorized for detection and/or diagnosis of SARS-CoV-2 by  FDA under an Emergency Use Authorization (EUA). This EUA will remain  in effect (meaning this test can be used) for the duration of the  Covid-19 declaration under Section 564(b)(1) of the Act, 21  U.S.C. section 360bbb-3(b)(1), unless the authorization is  terminated or revoked. Performed at [facility], [place] [address] [place], [place] [address]   SARS Coronavirus 2 by RT PCR (hospital order, performed in [facility] hospital lab) Nasopharyngeal Nasopharyngeal Swab     Status: Abnormal   Collection Time: 10/11/19  7:59 PM   Specimen: Nasopharyngeal Swab  Result Value Ref Range Status   SARS Coronavirus 2 POSITIVE (A) NEGATIVE Final    Comment: RESULT CALLED TO, READ BACK  BY AND VERIFIED WITHJosph Macho RN [phone] [id number] PHILLIPS C Performed at [facility], [address], [place], [place] [address]     Radiology Reports DG Chest [place] 1 View  Result Date: 10/11/2019 CLINICAL DATA:  Shortness of breath EXAM: PORTABLE CHEST 1 VIEW COMPARISON:  10/07/2019 FINDINGS: Interim development of bilateral left greater than right interstitial and ground-glass opacity, largely peripheral on the left. No pleural effusion. Mild cardiomegaly. No pneumothorax. IMPRESSION: Interim development of left greater than right interstitial and ground-glass opacity, suspicious for bilateral pneumonia, possible atypical or viral pneumonia. Electronically Signed   By: Donavan Foil M.D.   On: 10/11/2019 18:22

## 2019-10-15 NOTE — Progress Notes (Signed)
ANTICOAGULATION CONSULT NOTE  Pharmacy Consult for warfarin dosing Indication:  Hx of DVT/PE, lifelong anti-coagulation  Allergies  Allergen Reactions  . Iodine Itching    Patient Measurements: Height: 6' (182.9 cm) Weight: 260 lb (117.9 kg) IBW/kg (Calculated) : 77.6 Heparin Dosing Weight: HEPARIN DW (KG): 103.3  Vital Signs: Temp: 98 F (36.7 C) (01/19 0403) BP: 122/74 (01/19 0403) Pulse Rate: 61 (01/19 0403)  Labs: Recent Labs    10/13/19 0304 10/13/19 0304 10/14/19 0348 10/15/19 0145  HGB 13.5   < > 13.5 14.5  HCT 41.0  --  40.6 42.8  PLT 367  --  326 325  LABPROT 34.7*  --  30.0* 28.3*  INR 3.5*  --  2.9* 2.7*  CREATININE 0.88  --  0.95 1.03   < > = values in this interval not displayed.    Estimated Creatinine Clearance: 83.4 mL/min (by C-G formula based on SCr of 1.03 mg/dL).  Medical History: Past Medical History:  Diagnosis Date  . CPAP (continuous positive airway pressure) dependence   . H/O blood clots   . PE (pulmonary thromboembolism) (HCC)      Assessment:  Pharmacy consulted to dose warfarin for this 74 yo male on chronic anti-coagulation with warfarin for history of DVT/PE.  INR supra-therapeutic at last Coumadin clinic 2/2 doxy, pred, dec vit K intake from illness.     Home dose: warfarin 5mg  daily Last dose:  warfarin 5mg  on 10/10/19   INR still trending down after conservative dose yesterday, no bleeding reported. Drug interactions: dexamethasone  Goal of Therapy:  INR 2-3 Monitor platelets by anticoagulation protocol: Yes   Plan:  Coumadin 5 mg tonight Daily PT/INR  Monitor for signs and symptoms of bleeding.  , PharmD, BCPS 10/15/2019 7:21 AM

## 2019-10-15 NOTE — Progress Notes (Signed)
Patient eating dinner at this time. Packing up and taking up to 3rd floor via wheelchair. All meds given for day shift

## 2019-10-15 NOTE — Progress Notes (Signed)
Report called to [facility]. Will trasport patient to 3rd floor at 5 pm.

## 2019-10-16 LAB — CBC WITH DIFFERENTIAL/PLATELET
Abs Immature Granulocytes: 0.45 10*3/uL — ABNORMAL HIGH (ref 0.00–0.07)
Basophils Absolute: 0.1 10*3/uL (ref 0.0–0.1)
Basophils Relative: 0 %
Eosinophils Absolute: 0 10*3/uL (ref 0.0–0.5)
Eosinophils Relative: 0 %
HCT: 46.6 % (ref 39.0–52.0)
Hemoglobin: 15.5 g/dL (ref 13.0–17.0)
Immature Granulocytes: 4 %
Lymphocytes Relative: 8 %
Lymphs Abs: 1 10*3/uL (ref 0.7–4.0)
MCH: 30.5 pg (ref 26.0–34.0)
MCHC: 33.3 g/dL (ref 30.0–36.0)
MCV: 91.6 fL (ref 80.0–100.0)
Monocytes Absolute: 0.4 10*3/uL (ref 0.1–1.0)
Monocytes Relative: 3 %
Neutro Abs: 10.2 10*3/uL — ABNORMAL HIGH (ref 1.7–7.7)
Neutrophils Relative %: 85 %
Platelets: 302 10*3/uL (ref 150–400)
RBC: 5.09 MIL/uL (ref 4.22–5.81)
RDW: 13.6 % (ref 11.5–15.5)
WBC: 12.1 10*3/uL — ABNORMAL HIGH (ref 4.0–10.5)
nRBC: 0.7 % — ABNORMAL HIGH (ref 0.0–0.2)

## 2019-10-16 LAB — COMPREHENSIVE METABOLIC PANEL
ALT: 43 U/L (ref 0–44)
AST: 29 U/L (ref 15–41)
Albumin: 2.8 g/dL — ABNORMAL LOW (ref 3.5–5.0)
Alkaline Phosphatase: 54 U/L (ref 38–126)
Anion gap: 9 (ref 5–15)
BUN: 41 mg/dL — ABNORMAL HIGH (ref 8–23)
CO2: 25 mmol/L (ref 22–32)
Calcium: 8.4 mg/dL — ABNORMAL LOW (ref 8.9–10.3)
Chloride: 105 mmol/L (ref 98–111)
Creatinine, Ser: 0.98 mg/dL (ref 0.61–1.24)
GFR calc Af Amer: >60 mL/min (ref >60)
GFR calc non Af Amer: >60 mL/min (ref >60)
Glucose, Bld: 135 mg/dL — ABNORMAL HIGH (ref 70–99)
Potassium: 4.9 mmol/L (ref 3.5–5.1)
Sodium: 139 mmol/L (ref 135–145)
Total Bilirubin: 0.7 mg/dL (ref 0.3–1.2)
Total Protein: 6 g/dL — ABNORMAL LOW (ref 6.5–8.1)

## 2019-10-16 LAB — CULTURE, BLOOD (ROUTINE X 2)
Culture: NO GROWTH
Culture: NO GROWTH
Special Requests: ADEQUATE
Special Requests: ADEQUATE

## 2019-10-16 LAB — PROTIME-INR
INR: 3 — ABNORMAL HIGH (ref 0.8–1.2)
Prothrombin Time: 31.1 seconds — ABNORMAL HIGH (ref 11.4–15.2)

## 2019-10-16 LAB — BRAIN NATRIURETIC PEPTIDE: B Natriuretic Peptide: 72.2 pg/mL (ref 0.0–100.0)

## 2019-10-16 LAB — GLUCOSE, CAPILLARY
Glucose-Capillary: 116 mg/dL — ABNORMAL HIGH (ref 70–99)
Glucose-Capillary: 160 mg/dL — ABNORMAL HIGH (ref 70–99)
Glucose-Capillary: 159 mg/dL — ABNORMAL HIGH (ref 70–99)
Glucose-Capillary: 204 mg/dL — ABNORMAL HIGH (ref 70–99)

## 2019-10-16 LAB — MAGNESIUM: Magnesium: 2.4 mg/dL (ref 1.7–2.4)

## 2019-10-16 LAB — D-DIMER, QUANTITATIVE: D-Dimer, Quant: 2.28 ug/mL-FEU — ABNORMAL HIGH (ref 0.00–0.50)

## 2019-10-16 LAB — C-REACTIVE PROTEIN: CRP: 0.7 mg/dL (ref <1.0)

## 2019-10-16 MED ORDER — FUROSEMIDE 10 MG/ML IJ SOLN
40.0000 mg | Freq: Once | INTRAMUSCULAR | Status: AC
  Administered 2019-10-16: 40 mg via INTRAVENOUS
  Filled 2019-10-16: qty 4

## 2019-10-16 MED ORDER — WARFARIN SODIUM 2.5 MG PO TABS
2.5000 mg | ORAL_TABLET | Freq: Once | ORAL | Status: AC
  Administered 2019-10-16: 2.5 mg via ORAL
  Filled 2019-10-16: qty 1

## 2019-10-16 NOTE — Progress Notes (Signed)
Ambulation Note  Saturation Pre: 89% on 8L  Ambulation Distance: 230 ft  Saturation During Ambulation: 79-89% on 8L  Notes: Pt walked independently. Upon sitting down, sats dropped into the low 70s on 8L. After 10 minutes, sats only recovered into the high 70s. Increased to 10 L. After ~20 min, sats got as high as 84-85%, but no higher. Informed RN. Pt left in recliner with call bell within reach, sats were 84% on 10 L.  Alisia Ferrari, MS, ACSM CEP 10:53 AM 10/16/2019

## 2019-10-16 NOTE — Progress Notes (Signed)
PROGRESS NOTE                                                                                                                                                                                                             Patient Demographics:    Bruce Stanley, is a 74 y.o. male, DOB - 05-25-1946, PYK:[id number]  Outpatient Primary MD for the patient is Patient, No Pcp Per   Admit date - 10/11/2019   LOS - 5  Chief Complaint  Patient presents with  . Shortness of Breath       Brief Narrative: Patient is a 74 y.o. 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  See below for further details.   Subjective:   Patient in chair, appears comfortable, denies any headache, no fever, no chest pain or pressure, improved shortness of breath , no abdominal pain. No focal weakness.    Assessment  & Plan :   Acute Hypoxic Resp Failure due to Covid 19 Viral pneumonia: He had severe disease with extreme hypoxia, was treated with IV steroids, remdesivir along with Actemra, excellent response, now down to 4-6 L nasal cannula oxygen at rest and is symptom-free.  Will continue to monitor.  Encouraged to sit up in chair and daytime use flutter valve and I-S for pulmonary toiletry and prone in bed at night.  Rales resolved post Lasix on 1/20.    O2 requirements:  SpO2: 94 % O2 Flow Rate (L/min): 6 L/min   COVID-19 Labs: Recent Labs    10/14/19 0348 10/15/19 0145 10/16/19 0530  DDIMER 3.12* 3.25* 2.28*  CRP 2.4* 1.1* 0.7       Component Value Date/Time   BNP 72.2 10/16/2019 0530    Recent Labs  Lab 10/11/19 1745  PROCALCITON <0.10    Lab Results  Component Value Date   SARSCOV2NAA POSITIVE (A) 10/11/2019   SARSCOV2NAA POSITIVE (A) 10/11/2019      Hepatic Function Latest Ref Rng & Units 10/16/2019 10/15/2019 10/14/2019  Total Protein 6.5 - 8.1 g/dL 6.0(L) 6.0(L) 6.2(L)  Albumin 3.5 - 5.0 g/dL 2.8(L) 2.8(L) 2.8(L)  AST 15 - 41 U/L 29 31 32  ALT 0 - 44  U/L 43 41 36  Alk Phosphatase 38 - 126 U/L 54 56 52  Total Bilirubin 0.3 - 1.2 mg/dL 0.7 0.8 0.8     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 Yes; Fluid consistency: Thin  Diet effective now               Disposition Plan  :  Home in am  Barriers to discharge: Hypoxia requiring O2 supplementation/complete 5 days of IV Remdesivir  Antimicorbials  :    Anti-infectives (From admission, onward)   Start     Dose/Rate Route Frequency Ordered Stop   10/12/19 1000  remdesivir 100 mg in sodium chloride 0.9 % 100 mL IVPB     100 mg 200 mL/hr over 30 Minutes Intravenous Daily 10/11/19 2209 10/15/19 0954   10/11/19 2215  remdesivir 100 mg in sodium chloride 0.9 % 100 mL IVPB     100 mg 200 mL/hr over 30 Minutes Intravenous Every 30 min 10/11/19 2209 10/12/19 0010   10/11/19 1900  cefTRIAXone (ROCEPHIN) 1 g in sodium chloride 0.9 % 100 mL IVPB     1 g 200 mL/hr over 30 Minutes Intravenous  Once 10/11/19 1857 10/11/19 2004   10/11/19 1900  azithromycin (ZITHROMAX) 500 mg in sodium chloride 0.9 % 250 mL IVPB     500 mg 250 mL/hr over 60 Minutes Intravenous  Once 10/11/19 1857 10/11/19 2100     DVT Prophylaxis  : Coumadin  Lab Results  Component Value Date   INR 3.0 (H) 10/16/2019   INR 2.7 (H) 10/15/2019   INR 2.9 (H) 10/14/2019    Inpatient Medications  Scheduled Meds: . vitamin C   500 mg Oral Daily  . atorvastatin  40 mg Oral q1800  . dexamethasone (DECADRON) injection  10 mg Intravenous Q24H  . famotidine  20 mg Oral Daily  . fluticasone  2 spray Each Nare Daily  . insulin aspart  0-9 Units Subcutaneous TID WC  . Ipratropium-Albuterol  1 puff Inhalation Q6H  . omega-3 acid ethyl esters  1 g Oral Daily  . Warfarin - Pharmacist Dosing Inpatient   Does not apply q1800  . zinc sulfate  220 mg Oral Daily   Continuous Infusions: . sodium chloride Stopped (10/11/19 2004)   PRN Meds:.sodium chloride, acetaminophen, albuterol, benzonatate, chlorpheniramine-HYDROcodone, guaiFENesin-dextromethorphan   Time Spent in minutes 35  See all Orders from today for further details   Lala Lund M.D on 10/16/2019 at 9:40 AM  To page go to [email or web address] - use universal password  [facility] -  Office  [phone]    Objective:   Vitals:   10/15/19 1835 10/15/19 1900 10/16/19 0454 10/16/19 0726  BP: 113/68 109/70 (!) 89/55 119/73  Pulse: 89 94 (!) 57 68  Resp: (!) _0 Temp: 97.9 F (36.6 C) 98.1 F (36.7 C) 97.8 F (36.6 C) (!) 97.3 F (36.3 C)  TempSrc: Oral Oral Oral Oral  SpO2: (!) 89% 90% 94%   Weight:      Height:  Wt Readings from Last 3 Encounters:  10/11/19 117.9 kg     Intake/Output Summary (Last 24 hours) at 10/16/2019 0940 Last data filed at 10/16/2019 0800 Gross per 24 hour  Intake 600 ml  Output 1250 ml  Net -650 ml     Physical Exam  Awake Alert,   No new F.N deficits, Normal affect [place].AT,PERRAL Supple Neck,No JVD, No cervical lymphadenopathy appriciated.  Symmetrical Chest wall movement, Good air movement bilaterally, few rales RRR,No Gallops, Rubs or new Murmurs, No Parasternal Heave +ve B.Sounds, Abd Soft, No tenderness, No organomegaly appriciated, No rebound - guarding or rigidity. No Cyanosis, Clubbing or edema, No new Rash or bruise     Data Review:    CBC Recent Labs  Lab 10/12/19 0835  10/13/19 0304 10/14/19 0348 10/15/19 0145 10/16/19 0530  WBC 6.0 8.8 10.8* 11.0* 12.1*  HGB 15.3 13.5 13.5 14.5 15.5  HCT 45.7 41.0 40.6 42.8 46.6  PLT 376 367 326 325 302  MCV 90.0 91.3 91.0 90.5 91.6  MCH 30.1 30.1 30.3 30.7 30.5  MCHC 33.5 32.9 33.3 33.9 33.3  RDW 13.3 13.5 13.5 13.6 13.6  LYMPHSABS 0.5* 0.6* 0.7 0.9 1.0  MONOABS 0.1 0.2 0.3 0.3 0.4  EOSABS 0.0 0.0 0.0 0.0 0.0  BASOSABS 0.0 0.0 0.0 0.0 0.1    Chemistries  Recent Labs  Lab 10/12/19 0835 10/13/19 0304 10/14/19 0348 10/15/19 0145 10/16/19 0530  NA 141 138 139 139 139  K 4.0 4.0 4.2 4.5 4.9  CL 103 102 105 105 105  CO2 _0 GLUCOSE 147* 142* 133* 141* 135*  BUN 27* 36* 44* 47* 41*  CREATININE 1.00 0.88 0.95 1.03 0.98  CALCIUM 9.1 8.7* 8.6* 8.5* 8.4*  MG  --   --  2.5* 2.3 2.4  AST 40 33 32 31 29  ALT 39 35 36 41 43  ALKPHOS 63 53 52 56 54  BILITOT 0.6 1.0 0.8 0.8 0.7   ------------------------------------------------------------------------------------------------------------------ No results for input(s): CHOL, HDL, LDLCALC, TRIG, CHOLHDL, LDLDIRECT in the last 72 hours.  Lab Results  Component Value Date   HGBA1C 5.7 (H) 10/12/2019   ------------------------------------------------------------------------------------------------------------------ No results for input(s): TSH, T4TOTAL, T3FREE, THYROIDAB in the last 72 hours.  Invalid input(s): FREET3 ------------------------------------------------------------------------------------------------------------------ No results for input(s): VITAMINB12, FOLATE, FERRITIN, TIBC, IRON, RETICCTPCT in the last 72 hours.  Coagulation profile Recent Labs  Lab 10/12/19 0423 10/13/19 0304 10/14/19 0348 10/15/19 0145 10/16/19 0530  INR 4.2* 3.5* 2.9* 2.7* 3.0*    Recent Labs    10/15/19 0145 10/16/19 0530  DDIMER 3.25* 2.28*    Cardiac Enzymes No results for input(s): CKMB, TROPONINI, MYOGLOBIN in the last 168 hours.  Invalid  input(s): CK ------------------------------------------------------------------------------------------------------------------    Component Value Date/Time   BNP 72.2 10/16/2019 0530    Micro Results Recent Results (from the past 240 hour(s))  Blood Culture (routine x 2)     Status: None   Collection Time: 10/11/19  5:45 PM   Specimen: BLOOD RIGHT ARM  Result Value Ref Range Status   Specimen Description   Final    BLOOD RIGHT ARM Performed at [facility], [address] [place], [place] [address]    Special Requests   Final    BOTTLES DRAWN AEROBIC AND ANAEROBIC Blood Culture adequate volume Performed at [facility], [address] [place], [place] [address]    Culture   Final    NO GROWTH 5 DAYS Performed at  [facility], [place] [address] [place], [place] [address]    Report Status 10/16/2019 FINAL  Final  SARS Coronavirus 2 Ag (30 min TAT) - Nasal Swab (BD Veritor Kit)     Status: None   Collection Time: 10/11/19  5:45 PM   Specimen: Nasal Swab (BD Veritor Kit)  Result Value Ref Range Status   SARS Coronavirus 2 Ag NEGATIVE NEGATIVE Final    Comment: (NOTE) SARS-CoV-2 antigen NOT DETECTED.  Negative results are presumptive.  Negative results do not preclude SARS-CoV-2 infection and should not be used as the sole basis for treatment or other patient management decisions, including infection  control decisions, particularly in the presence of clinical signs and  symptoms consistent with COVID-19, or in those who have been in contact with the virus.  Negative results must be combined with clinical observations, patient history, and epidemiological information. The expected result is Negative. Fact Sheet for Patients: PodPark.tn Fact Sheet for Healthcare Providers: GiftContent.is This test is not yet approved or cleared by the [place] FDA and  has been authorized for detection  and/or diagnosis of SARS-CoV-2 by FDA under an Emergency Use Authorization (EUA).  This EUA will remain in effect (meaning this test can be used) for the duration of  the COVID-19 de claration under Section 564(b)(1) of the Act, 21 U.S.C. section 360bbb-3(b)(1), unless the authorization is terminated or revoked sooner. Performed at [facility], [address] [place], [place] [address]   Blood Culture (routine x 2)     Status: None   Collection Time: 10/11/19  5:53 PM   Specimen: BLOOD LEFT ARM  Result Value Ref Range Status   Specimen Description   Final    BLOOD LEFT ARM Performed at [facility], [address] [place], [place] [address]    Special Requests   Final    BOTTLES DRAWN AEROBIC AND ANAEROBIC Blood Culture adequate volume Performed at [facility], [address] [place], [place] [address]    Culture   Final    NO GROWTH 5 DAYS Performed at [facility], [place] [address], [place], [place] [address]    Report Status 10/16/2019 FINAL  Final  Respiratory Panel by RT PCR (Flu A&B, Covid) - Nasopharyngeal Swab     Status: Abnormal   Collection Time: 10/11/19  6:00 PM   Specimen: Nasopharyngeal Swab  Result Value Ref Range Status   SARS Coronavirus 2 by RT PCR POSITIVE (A) NEGATIVE Final    Comment: RESULT CALLED TO, READ BACK BY AND VERIFIED WITH: K NEAL RN 10/11/19 22 01 JDW (NOTE) SARS-CoV-2 target nucleic acids are DETECTED. SARS-CoV-2 RNA is generally detectable in upper respiratory specimens  during the acute phase of infection. Positive results are indicative of the presence of the identified virus, but do not rule out bacterial infection or co-infection with other pathogens not detected by the test. Clinical correlation with patient history and other diagnostic information is necessary to determine patient infection status. The expected result is Negative. Fact Sheet for Patients:   PinkCheek.be Fact Sheet for Healthcare Providers: GravelBags.it This test is not yet approved or cleared by the [place] FDA and  has been authorized for detection and/or diagnosis of SARS-CoV-2 by FDA under an Emergency Use Authorization (EUA).  This EUA will remain in effect (meaning this test can be used) for the d uration of  the COVID-19 declaration under Section 564(b)(1) of the Act, 21  U.S.C. section 360bbb-3(b)(1), unless the authorization is terminated or revoked sooner.    Influenza A by PCR NEGATIVE NEGATIVE Final   Influenza B by PCR NEGATIVE NEGATIVE Final    Comment: (NOTE) The Xpert Xpress SARS-CoV-2/FLU/RSV assay is intended as an aid in  the diagnosis of influenza from Nasopharyngeal swab specimens and  should not be used as a sole basis for treatment. Nasal washings and  aspirates are unacceptable for Xpert Xpress SARS-CoV-2/FLU/RSV  testing. Fact Sheet for Patients: PinkCheek.be Fact Sheet for Healthcare Providers: GravelBags.it This test is not yet approved or cleared by the [place] FDA and  has been authorized for detection and/or diagnosis of SARS-CoV-2 by  FDA under an Emergency Use Authorization (EUA). This EUA will remain  in effect (meaning this test can be used) for the duration of the  Covid-19 declaration under Section 564(b)(1) of the Act, 21  U.S.C. section 360bbb-3(b)(1), unless the authorization is  terminated or revoked. Performed at [facility], [place] [address], [place], [place] [address]   SARS Coronavirus 2 by RT PCR (hospital order, performed in [facility] hospital lab) Nasopharyngeal Nasopharyngeal Swab     Status: Abnormal   Collection Time: 10/11/19  7:59 PM   Specimen: Nasopharyngeal Swab  Result Value Ref Range Status   SARS Coronavirus 2 POSITIVE (A) NEGATIVE Final    Comment: RESULT CALLED TO, READ BACK  BY AND VERIFIED WITHJosph Macho RN [phone] [id number] PHILLIPS C Performed at [facility], [address] [place], [place] [address]     Radiology Reports DG Chest Pacolet 1 View  Result Date: 10/11/2019 CLINICAL DATA:  Shortness of breath EXAM: PORTABLE CHEST 1 VIEW COMPARISON:  10/07/2019 FINDINGS: Interim development of bilateral left greater than right interstitial and ground-glass opacity, largely peripheral on the left. No pleural effusion. Mild cardiomegaly. No pneumothorax. IMPRESSION: Interim development of left greater than right interstitial and ground-glass opacity, suspicious for bilateral pneumonia, possible atypical or viral pneumonia. Electronically Signed   By: Donavan Foil M.D.   On: 10/11/2019 18:22

## 2019-10-16 NOTE — Progress Notes (Signed)
ANTICOAGULATION CONSULT NOTE  Pharmacy Consult for warfarin dosing Indication:  Hx of DVT/PE, lifelong anti-coagulation  Allergies  Allergen Reactions  . Iodine Itching    Patient Measurements: Height: 6' (182.9 cm) Weight: 260 lb (117.9 kg) IBW/kg (Calculated) : 77.6 Heparin Dosing Weight: HEPARIN DW (KG): 103.3  Vital Signs: Temp: 97.3 F (36.3 C) (01/20 0726) Temp Source: Oral (01/20 0726) BP: 119/73 (01/20 0726) Pulse Rate: 68 (01/20 0726)  Labs: Recent Labs    10/14/19 0348 10/14/19 0348 10/15/19 0145 10/16/19 0530  HGB 13.5   < > 14.5 15.5  HCT 40.6  --  42.8 46.6  PLT 326  --  325 302  LABPROT 30.0*  --  28.3* 31.1*  INR 2.9*  --  2.7* 3.0*  CREATININE 0.95  --  1.03 0.98   < > = values in this interval not displayed.    Estimated Creatinine Clearance: 87.6 mL/min (by C-G formula based on SCr of 0.98 mg/dL).  Assessment: Pharmacy consulted to dose warfarin for this 74 yo male on chronic anti-coagulation with warfarin for history of DVT/PE.  INR supra-therapeutic at last Coumadin clinic 2/2 doxy, pred, dec vit K intake from illness.     Home dose: warfarin 5mg  daily Last dose:  warfarin 5mg  on 10/10/19   INR is at upper end of goal at 3. Drug interactions: dexamethasone. No bleeding noted.  Goal of Therapy:  INR 2-3 Monitor platelets by anticoagulation protocol: Yes   Plan:  Coumadin 2.5 mg PO tonight Daily PT/INR  Monitor for signs and symptoms of bleeding.  Thank you for involving pharmacy in this patient's care.  , PharmD, BCPS Clinical Pharmacist Clinical phone for 10/16/2019 until 3p is [phone] 10/16/2019 2:45 PM  **Pharmacist phone directory can be found on [email or web address] listed under [facility]**

## 2019-10-16 NOTE — Progress Notes (Signed)
Physical Therapy Treatment Patient Details Name: Bruce Stanley MRN: [id number] DOB: Jan 11, 1946 Today's Date: 10/16/2019    History of Present Illness 74 y/o male w/ hx of DVT/PE on coumadin, blood clots, OSA w/ CPAP dependence, presented to ED with SOB and cough. Sx started 1/5 but pt had tested _ for COVID. Pt takes care of spouse at home w/ caregiver coming in once a week    PT Comments    Patient received on 6 L [place], SPO@ on finger 93%. Patient ambulated x 100' with SPO2 dropping to 78% via finger. While resting placed probe on ear with resting readings on 4 L 90%. Placed nelcor  On forhead  With resting on 4 L 95%. Ambulated x 250' on 4 L to start, after ! 100', SPO2 83%. Increased to 6 L with increase to 88%. After return to room and resting, forehead probe on 4 L 94%. On RA patient drops down to 84%. Patient will benefit from increased opportunity to ambulate, Patient hopeful to DC Friday. Currently patient requires 4-6 L of Oxygen. I did talk to patient to shorten his distances and Saturation will stay higher. He should do more frequent short walks.  Continue PT to wean and monitor Oxygen. QUESTION from patient for MD to address-- Patient uses a CPAP, what does he do if he is on O2.    Follow Up Recommendations  Home health PT- patient/wife have HHservices for CPAP , etc already and would like to use the same if he needs HHPT and O2.     Equipment Recommendations  None recommended by PT    Recommendations for Other Services       Precautions / Restrictions Precautions Precautions: Other (comment) Precaution Comments: Monitor O2 SATs, desats quickly    Mobility  Bed Mobility               General bed mobility comments: in recliner  Transfers   Equipment used: None Transfers: Sit to/from Stand Sit to Stand: Supervision            Ambulation/Gait Ambulation/Gait assistance: Supervision;Min guard Gait Distance (Feet): 100 Feet Assistive device: None Gait  Pattern/deviations: Step-through pattern     General Gait Details: 4 LPM/[place] with SPO2 readinng 70% on finger probe. Gradual return to 85in 2 mins. Check ear to finger with  higher reading. Placed nelcor on forehead with even higher at 95% on 4 l.   Stairs             Wheelchair Mobility    Modified Rankin (Stroke Patients Only)       Balance Overall balance assessment: Mild deficits observed, not formally tested                                          Cognition Arousal/Alertness: Awake/alert Behavior During Therapy: WFL for tasks assessed/performed                                          Exercises      General Comments General comments (skin integrity, edema, etc.): at times reaches for objects,      Pertinent Vitals/Pain Pain Assessment: No/denies pain    Home Living  Prior Function            PT Goals (current goals can now be found in the care plan section) Progress towards PT goals: Progressing toward goals    Frequency    Min 3X/week      PT Plan      Co-evaluation              AM-PAC PT "6 Clicks" Mobility   Outcome Measure  Help needed turning from your back to your side while in a flat bed without using bedrails?: None Help needed moving from lying on your back to sitting on the side of a flat bed without using bedrails?: None Help needed moving to and from a bed to a chair (including a wheelchair)?: A Little Help needed standing up from a chair using your arms (e.g., wheelchair or bedside chair)?: A Little Help needed to walk in hospital room?: A Little Help needed climbing 3-5 steps with a railing? : A Little 6 Click Score: 20    End of Session Equipment Utilized During Treatment: Oxygen Activity Tolerance: Patient tolerated treatment well Patient left: in chair;with call bell/phone within reach Nurse Communication: Mobility status PT Visit Diagnosis: Muscle  weakness (generalized) (M62.81)     Time: 2353-6144 PT Time Calculation (min) (ACUTE ONLY): 73 min  Charges:  $Gait Training: 38-52 mins $Self Care/Home Management: [place] Pager [phone] Office [phone]    Claretha Cooper 10/16/2019, 4:36 PM

## 2019-10-16 NOTE — Plan of Care (Signed)
  Problem: Education: Goal: Knowledge of risk factors and measures for prevention of condition will improve Outcome: Progressing   Problem: Coping: Goal: Psychosocial and spiritual needs will be supported Outcome: Progressing   Problem: Respiratory: Goal: Will maintain a patent airway Outcome: Not Met (Pt is still on 4L of oxygen. Pt O2 drops to the 80's when ambulating. It also takes patient over 30 minutes to recover) Goal: Complications related to the disease process, condition or treatment will be avoided or minimized Outcome: Not Met (add Reason)

## 2019-10-17 LAB — GLUCOSE, CAPILLARY
Glucose-Capillary: 115 mg/dL — ABNORMAL HIGH (ref 70–99)
Glucose-Capillary: 104 mg/dL — ABNORMAL HIGH (ref 70–99)
Glucose-Capillary: 112 mg/dL — ABNORMAL HIGH (ref 70–99)
Glucose-Capillary: 143 mg/dL — ABNORMAL HIGH (ref 70–99)

## 2019-10-17 LAB — COMPREHENSIVE METABOLIC PANEL
ALT: 46 U/L — ABNORMAL HIGH (ref 0–44)
AST: 30 U/L (ref 15–41)
Albumin: 2.7 g/dL — ABNORMAL LOW (ref 3.5–5.0)
Alkaline Phosphatase: 52 U/L (ref 38–126)
Anion gap: 10 (ref 5–15)
BUN: 43 mg/dL — ABNORMAL HIGH (ref 8–23)
CO2: 26 mmol/L (ref 22–32)
Calcium: 8.1 mg/dL — ABNORMAL LOW (ref 8.9–10.3)
Chloride: 100 mmol/L (ref 98–111)
Creatinine, Ser: 1.01 mg/dL (ref 0.61–1.24)
GFR calc Af Amer: >60 mL/min (ref >60)
GFR calc non Af Amer: >60 mL/min (ref >60)
Glucose, Bld: 93 mg/dL (ref 70–99)
Potassium: 4.1 mmol/L (ref 3.5–5.1)
Sodium: 136 mmol/L (ref 135–145)
Total Bilirubin: 0.7 mg/dL (ref 0.3–1.2)
Total Protein: 5.6 g/dL — ABNORMAL LOW (ref 6.5–8.1)

## 2019-10-17 LAB — CBC WITH DIFFERENTIAL/PLATELET
Abs Immature Granulocytes: 0.45 10*3/uL — ABNORMAL HIGH (ref 0.00–0.07)
Basophils Absolute: 0.1 10*3/uL (ref 0.0–0.1)
Basophils Relative: 1 %
Eosinophils Absolute: 0.1 10*3/uL (ref 0.0–0.5)
Eosinophils Relative: 1 %
HCT: 43.8 % (ref 39.0–52.0)
Hemoglobin: 14.7 g/dL (ref 13.0–17.0)
Immature Granulocytes: 4 %
Lymphocytes Relative: 14 %
Lymphs Abs: 1.6 10*3/uL (ref 0.7–4.0)
MCH: 30.3 pg (ref 26.0–34.0)
MCHC: 33.6 g/dL (ref 30.0–36.0)
MCV: 90.3 fL (ref 80.0–100.0)
Monocytes Absolute: 0.5 10*3/uL (ref 0.1–1.0)
Monocytes Relative: 5 %
Neutro Abs: 8.9 10*3/uL — ABNORMAL HIGH (ref 1.7–7.7)
Neutrophils Relative %: 75 %
Platelets: 242 10*3/uL (ref 150–400)
RBC: 4.85 MIL/uL (ref 4.22–5.81)
RDW: 13.7 % (ref 11.5–15.5)
WBC: 11.7 10*3/uL — ABNORMAL HIGH (ref 4.0–10.5)
nRBC: 0.5 % — ABNORMAL HIGH (ref 0.0–0.2)

## 2019-10-17 LAB — BRAIN NATRIURETIC PEPTIDE: B Natriuretic Peptide: 59.7 pg/mL (ref 0.0–100.0)

## 2019-10-17 LAB — MAGNESIUM: Magnesium: 2.4 mg/dL (ref 1.7–2.4)

## 2019-10-17 LAB — C-REACTIVE PROTEIN: CRP: <0.5 mg/dL (ref <1.0)

## 2019-10-17 LAB — PROTIME-INR
INR: 3.6 — ABNORMAL HIGH (ref 0.8–1.2)
Prothrombin Time: 36 seconds — ABNORMAL HIGH (ref 11.4–15.2)

## 2019-10-17 LAB — D-DIMER, QUANTITATIVE: D-Dimer, Quant: 2.07 ug/mL-FEU — ABNORMAL HIGH (ref 0.00–0.50)

## 2019-10-17 NOTE — Progress Notes (Signed)
PROGRESS NOTE                                                                                                                                                                                                             Patient Demographics:    Bruce Stanley, is a 74 y.o. male, DOB - 1945-12-07, RAQ:[id number]  Outpatient Primary MD for the patient is Patient, No Pcp Per   Admit date - 10/11/2019   LOS - 6  Chief Complaint  Patient presents with  . Shortness of Breath       Brief Narrative: Patient is a 74 y.o. 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  See below for further details.   Subjective:   Patient in bed, appears comfortable, denies any headache, no fever, no chest pain or pressure, no shortness of breath , no abdominal pain. No focal weakness.    Assessment  & Plan :   Acute Hypoxic Resp Failure due to Covid 19 Viral pneumonia: He had severe disease with extreme hypoxia, was treated with IV steroids, remdesivir along with Actemra, excellent response, now down to 4 L nasal cannula oxygen at rest and is symptom-free.  Will continue to monitor.  Encouraged to sit up in chair and daytime use flutter valve and I-S for pulmonary toiletry and prone in bed at night.  Rales resolved post Lasix on 1/20.    O2 requirements:  SpO2: 93 % O2 Flow Rate (L/min): 4 L/min   COVID-19 Labs: Recent Labs    10/15/19 0145 10/16/19 0530 10/17/19 0525  DDIMER 3.25* 2.28* 2.07*  CRP 1.1* 0.7 <0.5       Component Value Date/Time   BNP 59.7 10/17/2019 0525    Recent Labs  Lab 10/11/19 1745  PROCALCITON <0.10    Lab Results  Component Value Date   SARSCOV2NAA POSITIVE (A) 10/11/2019   SARSCOV2NAA POSITIVE (A) 10/11/2019      Hepatic Function Latest Ref Rng & Units 10/17/2019 10/16/2019 10/15/2019  Total Protein 6.5 - 8.1 g/dL 5.6(L) 6.0(L) 6.0(L)  Albumin 3.5 - 5.0 g/dL 2.7(L) 2.8(L) 2.8(L)  AST 15 - 41 U/L '30 29 31  ' ALT 0 - 44  U/L 46(H) 43 41  Alk Phosphatase 38 - 126 U/L 52 54 56  Total Bilirubin 0.3 - 1.2 mg/dL 0.7 0.7 0.8     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 Yes; Fluid consistency: Thin  Diet effective now               Disposition Plan  :  Home in am  Barriers to discharge: Hypoxia requiring O2 supplementation up to 4-6 L  Antimicorbials  :    Anti-infectives (From admission, onward)   Start     Dose/Rate Route Frequency Ordered Stop   10/12/19 1000  remdesivir 100 mg in sodium chloride 0.9 % 100 mL IVPB     100 mg 200 mL/hr over 30 Minutes Intravenous Daily 10/11/19 2209 10/15/19 0954   10/11/19 2215  remdesivir 100 mg in sodium chloride 0.9 % 100 mL IVPB     100 mg 200 mL/hr over 30 Minutes Intravenous Every 30 min 10/11/19 2209 10/12/19 0010   10/11/19 1900  cefTRIAXone (ROCEPHIN) 1 g in sodium chloride 0.9 % 100 mL IVPB     1 g 200 mL/hr over 30 Minutes Intravenous  Once 10/11/19 1857 10/11/19 2004   10/11/19 1900  azithromycin (ZITHROMAX) 500 mg in sodium chloride 0.9 % 250 mL IVPB     500 mg 250 mL/hr over 60 Minutes Intravenous  Once 10/11/19 1857 10/11/19 2100     DVT Prophylaxis  : Coumadin  Lab Results  Component Value Date   INR 3.6 (H) 10/17/2019   INR 3.0 (H) 10/16/2019   INR 2.7 (H) 10/15/2019    Inpatient Medications  Scheduled Meds: . vitamin C  500 mg Oral Daily   . atorvastatin  40 mg Oral q1800  . dexamethasone (DECADRON) injection  10 mg Intravenous Q24H  . famotidine  20 mg Oral Daily  . fluticasone  2 spray Each Nare Daily  . insulin aspart  0-9 Units Subcutaneous TID WC  . Ipratropium-Albuterol  1 puff Inhalation Q6H  . omega-3 acid ethyl esters  1 g Oral Daily  . Warfarin - Pharmacist Dosing Inpatient   Does not apply q1800  . zinc sulfate  220 mg Oral Daily   Continuous Infusions: . sodium chloride Stopped (10/11/19 2004)   PRN Meds:.sodium chloride, acetaminophen, albuterol, benzonatate, chlorpheniramine-HYDROcodone, guaiFENesin-dextromethorphan   Time Spent in minutes 35  See all Orders from today for further details   Lala Lund M.D on 10/17/2019 at 10:12 AM  To page go to [email or web address] - use universal password  [facility] -  Office  [phone]    Objective:   Vitals:   10/16/19 1600 10/16/19 1633 10/16/19 2000 10/17/19 0734  BP:  101/76 120/88 100/69  Pulse:  75 94 87  Resp:  18 (!) 24 20  Temp:  (!) 97.4 F (36.3 C) 98.1 F (36.7 C) 98.2 F (36.8 C)  TempSrc:  Oral Oral Oral  SpO2: 97% 91% 92% 93%  Weight:      Height:        Wt Readings from Last 3  Encounters:  10/11/19 117.9 kg     Intake/Output Summary (Last 24 hours) at 10/17/2019 1012 Last data filed at 10/17/2019 0947 Gross per 24 hour  Intake 60 ml  Output 975 ml  Net -915 ml     Physical Exam  Awake Alert, No new F.N deficits, Normal affect [place].AT,PERRAL Supple Neck,No JVD, No cervical lymphadenopathy appriciated.  Symmetrical Chest wall movement, Good air movement bilaterally, CTAB RRR,No Gallops, Rubs or new Murmurs, No Parasternal Heave +ve B.Sounds, Abd Soft, No tenderness, No organomegaly appriciated, No rebound - guarding or rigidity. No Cyanosis, Clubbing or edema, No new Rash or bruise    Data Review:    CBC Recent Labs  Lab 10/13/19 0304 10/14/19 0348 10/15/19 0145 10/16/19 0530 10/17/19 0525  WBC 8.8  10.8* 11.0* 12.1* 11.7*  HGB 13.5 13.5 14.5 15.5 14.7  HCT 41.0 40.6 42.8 46.6 43.8  PLT 367 326 325 302 242  MCV 91.3 91.0 90.5 91.6 90.3  MCH 30.1 30.3 30.7 30.5 30.3  MCHC 32.9 33.3 33.9 33.3 33.6  RDW 13.5 13.5 13.6 13.6 13.7  LYMPHSABS 0.6* 0.7 0.9 1.0 1.6  MONOABS 0.2 0.3 0.3 0.4 0.5  EOSABS 0.0 0.0 0.0 0.0 0.1  BASOSABS 0.0 0.0 0.0 0.1 0.1    Chemistries  Recent Labs  Lab 10/13/19 0304 10/14/19 0348 10/15/19 0145 10/16/19 0530 10/17/19 0525  NA 138 139 139 139 136  K 4.0 4.2 4.5 4.9 4.1  CL 102 105 105 105 100  CO2 '26 25 24 25 26  ' GLUCOSE 142* 133* 141* 135* 93  BUN 36* 44* 47* 41* 43*  CREATININE 0.88 0.95 1.03 0.98 1.01  CALCIUM 8.7* 8.6* 8.5* 8.4* 8.1*  MG  --  2.5* 2.3 2.4 2.4  AST 33 32 '31 29 30  ' ALT 35 36 41 43 46*  ALKPHOS 53 52 56 54 52  BILITOT 1.0 0.8 0.8 0.7 0.7   ------------------------------------------------------------------------------------------------------------------ No results for input(s): CHOL, HDL, LDLCALC, TRIG, CHOLHDL, LDLDIRECT in the last 72 hours.  Lab Results  Component Value Date   HGBA1C 5.7 (H) 10/12/2019   ------------------------------------------------------------------------------------------------------------------ No results for input(s): TSH, T4TOTAL, T3FREE, THYROIDAB in the last 72 hours.  Invalid input(s): FREET3 ------------------------------------------------------------------------------------------------------------------ No results for input(s): VITAMINB12, FOLATE, FERRITIN, TIBC, IRON, RETICCTPCT in the last 72 hours.  Coagulation profile Recent Labs  Lab 10/13/19 0304 10/14/19 0348 10/15/19 0145 10/16/19 0530 10/17/19 0525  INR 3.5* 2.9* 2.7* 3.0* 3.6*    Recent Labs    10/16/19 0530 10/17/19 0525  DDIMER 2.28* 2.07*    Cardiac Enzymes No results for input(s): CKMB, TROPONINI, MYOGLOBIN in the last 168 hours.  Invalid input(s):  CK ------------------------------------------------------------------------------------------------------------------    Component Value Date/Time   BNP 59.7 10/17/2019 0525    Micro Results Recent Results (from the past 240 hour(s))  Blood Culture (routine x 2)     Status: None   Collection Time: 10/11/19  5:45 PM   Specimen: BLOOD RIGHT ARM  Result Value Ref Range Status   Specimen Description   Final    BLOOD RIGHT ARM Performed at [facility], [address] [place],  [address]    Special Requests   Final    BOTTLES DRAWN AEROBIC AND ANAEROBIC Blood Culture adequate volume Performed at [facility], [address] [place], [place] [address]    Culture   Final    NO GROWTH 5 DAYS Performed at [facility], [place] [address], [place],  [address]  Report Status 10/16/2019 FINAL  Final  SARS Coronavirus 2 Ag (30 min TAT) - Nasal Swab (BD Veritor Kit)     Status: None   Collection Time: 10/11/19  5:45 PM   Specimen: Nasal Swab (BD Veritor Kit)  Result Value Ref Range Status   SARS Coronavirus 2 Ag NEGATIVE NEGATIVE Final    Comment: (NOTE) SARS-CoV-2 antigen NOT DETECTED.  Negative results are presumptive.  Negative results do not preclude SARS-CoV-2 infection and should not be used as the sole basis for treatment or other patient management decisions, including infection  control decisions, particularly in the presence of clinical signs and  symptoms consistent with COVID-19, or in those who have been in contact with the virus.  Negative results must be combined with clinical observations, patient history, and epidemiological information. The expected result is Negative. Fact Sheet for Patients: PodPark.tn Fact Sheet for Healthcare Providers: GiftContent.is This test is not yet approved or cleared by the [place] FDA and  has been authorized for detection and/or  diagnosis of SARS-CoV-2 by FDA under an Emergency Use Authorization (EUA).  This EUA will remain in effect (meaning this test can be used) for the duration of  the COVID-19 de claration under Section 564(b)(1) of the Act, 21 U.S.C. section 360bbb-3(b)(1), unless the authorization is terminated or revoked sooner. Performed at [facility], [address] [place], [place] [address]   Blood Culture (routine x 2)     Status: None   Collection Time: 10/11/19  5:53 PM   Specimen: BLOOD LEFT ARM  Result Value Ref Range Status   Specimen Description   Final    BLOOD LEFT ARM Performed at [facility], [address] [place], [place] [address]    Special Requests   Final    BOTTLES DRAWN AEROBIC AND ANAEROBIC Blood Culture adequate volume Performed at [facility], [address] [place], [place] [address]    Culture   Final    NO GROWTH 5 DAYS Performed at [facility], [place] [address], [place], [place] [address]    Report Status 10/16/2019 FINAL  Final  Respiratory Panel by RT PCR (Flu A&B, Covid) - Nasopharyngeal Swab     Status: Abnormal   Collection Time: 10/11/19  6:00 PM   Specimen: Nasopharyngeal Swab  Result Value Ref Range Status   SARS Coronavirus 2 by RT PCR POSITIVE (A) NEGATIVE Final    Comment: RESULT CALLED TO, READ BACK BY AND VERIFIED WITH: K NEAL RN 10/11/19 22 01 JDW (NOTE) SARS-CoV-2 target nucleic acids are DETECTED. SARS-CoV-2 RNA is generally detectable in upper respiratory specimens  during the acute phase of infection. Positive results are indicative of the presence of the identified virus, but do not rule out bacterial infection or co-infection with other pathogens not detected by the test. Clinical correlation with patient history and other diagnostic information is necessary to determine patient infection status. The expected result is Negative. Fact Sheet for Patients:   PinkCheek.be Fact Sheet for Healthcare Providers: GravelBags.it This test is not yet approved or cleared by the [place] FDA and  has been authorized for detection and/or diagnosis of SARS-CoV-2 by FDA under an Emergency Use Authorization (EUA).  This EUA will remain in effect (meaning this test can be used) for the d uration of  the COVID-19 declaration under Section 564(b)(1) of the Act, 21 U.S.C. section 360bbb-3(b)(1), unless the authorization is terminated or revoked sooner.  Influenza A by PCR NEGATIVE NEGATIVE Final   Influenza B by PCR NEGATIVE NEGATIVE Final    Comment: (NOTE) The Xpert Xpress SARS-CoV-2/FLU/RSV assay is intended as an aid in  the diagnosis of influenza from Nasopharyngeal swab specimens and  should not be used as a sole basis for treatment. Nasal washings and  aspirates are unacceptable for Xpert Xpress SARS-CoV-2/FLU/RSV  testing. Fact Sheet for Patients: PinkCheek.be Fact Sheet for Healthcare Providers: GravelBags.it This test is not yet approved or cleared by the [place] FDA and  has been authorized for detection and/or diagnosis of SARS-CoV-2 by  FDA under an Emergency Use Authorization (EUA). This EUA will remain  in effect (meaning this test can be used) for the duration of the  Covid-19 declaration under Section 564(b)(1) of the Act, 21  U.S.C. section 360bbb-3(b)(1), unless the authorization is  terminated or revoked. Performed at [facility], [place] [address] [place], [place] [address]   SARS Coronavirus 2 by RT PCR (hospital order, performed in [facility] hospital lab) Nasopharyngeal Nasopharyngeal Swab     Status: Abnormal   Collection Time: 10/11/19  7:59 PM   Specimen: Nasopharyngeal Swab  Result Value Ref Range Status   SARS Coronavirus 2 POSITIVE (A) NEGATIVE Final    Comment: RESULT CALLED TO, READ BACK  BY AND VERIFIED WITHJosph Macho RN [phone] [id number] [place] Performed at [facility], [address] [place], [place] [address]     Radiology Reports DG Chest [place] 1 View  Result Date: 10/11/2019 CLINICAL DATA:  Shortness of breath EXAM: PORTABLE CHEST 1 VIEW COMPARISON:  10/07/2019 FINDINGS: Interim development of bilateral left greater than right interstitial and ground-glass opacity, largely peripheral on the left. No pleural effusion. Mild cardiomegaly. No pneumothorax. IMPRESSION: Interim development of left greater than right interstitial and ground-glass opacity, suspicious for bilateral pneumonia, possible atypical or viral pneumonia. Electronically Signed   By: Donavan Foil M.D.   On: 10/11/2019 18:22

## 2019-10-17 NOTE — Progress Notes (Signed)
ANTICOAGULATION CONSULT NOTE - Follow-Up  Pharmacy Consult for warfarin dosing Indication:  Hx of DVT/PE, lifelong anti-coagulation  Allergies  Allergen Reactions  . Iodine Itching    Patient Measurements: Height: 6' (182.9 cm) Weight: 260 lb (117.9 kg) IBW/kg (Calculated) : 77.6 Heparin Dosing Weight: HEPARIN DW (KG): 103.3  Vital Signs:    Labs: Recent Labs    10/15/19 0145 10/15/19 0145 10/16/19 0530 10/17/19 0525  HGB 14.5   < > 15.5 14.7  HCT 42.8  --  46.6 43.8  PLT 325  --  302 242  LABPROT 28.3*  --  31.1* 36.0*  INR 2.7*  --  3.0* 3.6*  CREATININE 1.03  --  0.98 1.01   < > = values in this interval not displayed.    Estimated Creatinine Clearance: 85 mL/min (by C-G formula based on SCr of 1.01 mg/dL).  Assessment: Pharmacy consulted to dose warfarin for this 74 yo male on chronic anti-coagulation for history of DVT/PE.  INR supra-therapeutic at last Coumadin clinic 2/2 doxy, pred, dec vit K intake from illness.     Home dose: warfarin 5mg  daily  INR is at supratherapeutic at 3.6. Drug interactions: dexamethasone. No bleeding noted.  Goal of Therapy:  INR 2-3 Monitor platelets by anticoagulation protocol: Yes   Plan:  No warfarin tonight Daily PT/INR  Monitor for signs and symptoms of bleeding.  Thank you for involving pharmacy in this patient's care.  , Pharm.D., BCPS Clinical Pharmacist Clinical phone for 10/17/2019 from 8:30-4:00 is [phone].  **Pharmacist phone directory can be found on [email or web address] listed under [facility].  10/17/2019 9:17 AM

## 2019-10-17 NOTE — Progress Notes (Signed)
Physical Therapy Treatment Patient Details Name: Bruce Stanley MRN: [id number] DOB: [birth year] Today's Date: 10/17/2019    History of Present Illness 74 y/o male w/ hx of DVT/PE on coumadin, blood clots, OSA w/ CPAP dependence, presented to ED with SOB and cough. Sx started 1/5 but pt had tested _ for COVID. Pt takes care of spouse at home w/ caregiver coming in once a week    PT Comments    Pt making steady progress with activity tolerance. Amb on 4L O2 with SpO2 to 84%. Used Nelcor forehead probe.    Follow Up Recommendations  Home health PT     Equipment Recommendations  None recommended by PT    Recommendations for Other Services       Precautions / Restrictions Precautions Precautions: Other (comment) Precaution Comments: watch SpO2 Restrictions Weight Bearing Restrictions: No    Mobility  Bed Mobility               General bed mobility comments: Pt up in recliner  Transfers Overall transfer level: Modified independent Equipment used: None Transfers: Sit to/from [facility] Sit to Stand: Modified independent (Device/Increase time)            Ambulation/Gait Ambulation/Gait assistance: Supervision Gait Distance (Feet): 250 Feet Assistive device: None Gait Pattern/deviations: Step-through pattern;Decreased stride length   Gait velocity interpretation: >2.62 ft/sec, indicative of community ambulatory General Gait Details: supervision for safety and to monitor SpO2   Stairs             Wheelchair Mobility    Modified Rankin (Stroke Patients Only)       Balance Overall balance assessment: Mild deficits observed, not formally tested                                          Cognition Arousal/Alertness: Awake/alert Behavior During Therapy: WFL for tasks assessed/performed Overall Cognitive Status: Within Functional Limits for tasks assessed                                         Exercises Other Exercises Other Exercises: Incentive spirometer x 10. Pulling 1226mL.    General Comments General comments (skin integrity, edema, etc.): At rest on 4L pt 93%. Amb on 4L with SpO2 84%. Recovered to >90% after 2 minutes seated rest with cues for pursed lip breathing. Used Nelcor forehead probe.       Pertinent Vitals/Pain Pain Assessment: No/denies pain    Home Living                      Prior Function            PT Goals (current goals can now be found in the care plan section) Progress towards PT goals: Progressing toward goals    Frequency    Min 3X/week      PT Plan Current plan remains appropriate    Co-evaluation              AM-PAC PT "6 Clicks" Mobility   Outcome Measure  Help needed turning from your back to your side while in a flat bed without using bedrails?: None Help needed moving from lying on your back to sitting on the side of a flat bed without using bedrails?: None Help needed moving  to and from a bed to a chair (including a wheelchair)?: A Little Help needed standing up from a chair using your arms (e.g., wheelchair or bedside chair)?: None Help needed to walk in hospital room?: A Little Help needed climbing 3-5 steps with a railing? : A Little 6 Click Score: 21    End of Session Equipment Utilized During Treatment: Oxygen Activity Tolerance: Patient tolerated treatment well Patient left: in chair;with call bell/phone within reach   PT Visit Diagnosis: Muscle weakness (generalized) (M62.81)     Time: 2542-7062 PT Time Calculation (min) (ACUTE ONLY): 18 min  Charges:  $Gait Training: 8-22 mins                     [facility] PT Acute Rehabilitation Services Pager [phone] Office [phone]    Angelina Ok [facility] 10/17/2019, 12:37 PM

## 2019-10-17 NOTE — Progress Notes (Signed)
Occupational Therapy Treatment Patient Details Name: Bruce Stanley MRN: [id number] DOB: 1945/11/30 Today's Date: 10/17/2019    History of present illness 74 y/o male w/ hx of DVT/PE on coumadin, blood clots, OSA w/ CPAP dependence, presented to ED with SOB and cough. Sx started 1/5 but pt had tested _ for COVID. Pt takes care of spouse at home w/ caregiver coming in once a week   OT comments  Pt progressing towards established OT goals. Pt performing LB bathing and dressing with supervision and SpO2 dropping to 83% on 4L, and pt requiring cues for rest breaks and purse lip breathing. Pt performing functional mobility in hallway on 4L with SPO2 dropping to 84%; requiring standing rest breaks and cues for breathing. Continued educaton on [facility] for ADLs and light IADLs; pt verbalizing understanding. Continue to recommend dc to home and will continue to follow acutely as admitted.    Follow Up Recommendations  No OT follow up;Supervision - Intermittent    Equipment Recommendations  None recommended by OT    Recommendations for Other Services      Precautions / Restrictions Precautions Precautions: Other (comment) Precaution Comments: watch SpO2 Restrictions Weight Bearing Restrictions: No       Mobility Bed Mobility               General bed mobility comments: Pt up in recliner  Transfers Overall transfer level: Needs assistance Equipment used: None Transfers: Sit to/from Stand;Stand Pivot Transfers Sit to Stand: Supervision         General transfer comment: Cues for safety    Balance Overall balance assessment: Mild deficits observed, not formally tested                                         ADL either performed or assessed with clinical judgement   ADL Overall ADL's : Needs assistance/impaired             Lower Body Bathing: Supervison/ safety;Sit to/from stand Lower Body Bathing Details (indicate cue type and reason): Supervision for safety  as pt performed LB bathing and peri care at sink.     Lower Body Dressing: Supervision/safety;Set up;Sit to/from stand Lower Body Dressing Details (indicate cue type and reason): Supervision for safety as pt doffed/donned underwear             Functional mobility during ADLs: Set up;Supervision/safety(in hallway) General ADL Comments: Throughout pt requiring cues to rest and perform purse lip breathing     Vision       Perception     Praxis      Cognition Arousal/Alertness: Awake/alert Behavior During Therapy: WFL for tasks assessed/performed Overall Cognitive Status: Within Functional Limits for tasks assessed                                 General Comments: Highly motivated to return home and participate in therapy        Exercises Exercises: Other exercises Other Exercises Other Exercises: Provided handout on energy conseravtion and reviewed. Pt able to verbalize understanding. would benefit from continued education   Shoulder Instructions       General Comments SpO2 dropping to 84% on 4L O2 with activity and pt requiring cues to purse lip breathing and take rest breaks both in standing and seated    Pertinent Vitals/ Pain  Pain Assessment: No/denies pain  Home Living                                          Prior Functioning/Environment              Frequency  Min 3X/week        Progress Toward Goals  OT Goals(current goals can now be found in the care plan section)  Progress towards OT goals: Progressing toward goals  Acute Rehab OT Goals Patient Stated Goal: to go home Time For Goal Achievement: 10/27/19 Potential to Achieve Goals: Good ADL Goals Pt Will Perform Grooming: Independently;standing Pt Will Perform Lower Body Bathing: with modified independence;sit to/from stand Pt Will Perform Lower Body Dressing: with modified independence;sit to/from stand Pt Will Transfer to Toilet:  Independently;ambulating;regular height toilet Pt Will Perform Toileting - Clothing Manipulation and hygiene: Independently;sit to/from stand Additional ADL Goal #1: Pt to recall and verbalize 3 energy conservation strategies with 0 verbal cues. Additional ADL Goal #2: Pt to recall and verbalize 3 fall prevention strategies with 0 verbal cues. Additional ADL Goal #3: Pt to tolerate standing up to 10 min independently with SpO2 maintaining in 90s, in preparation for ADLs.  Plan Discharge plan remains appropriate    Co-evaluation                 AM-PAC OT "6 Clicks" Daily Activity     Outcome Measure   Help from another person eating meals?: None Help from another person taking care of personal grooming?: A Little Help from another person toileting, which includes using toliet, bedpan, or urinal?: A Little Help from another person bathing (including washing, rinsing, drying)?: A Little Help from another person to put on and taking off regular upper body clothing?: A Little Help from another person to put on and taking off regular lower body clothing?: A Little 6 Click Score: 19    End of Session Equipment Utilized During Treatment: Rolling walker;Oxygen(4L)  OT Visit Diagnosis: Unsteadiness on feet (R26.81);Muscle weakness (generalized) (M62.81)   Activity Tolerance Patient tolerated treatment well   Patient Left in chair;with call bell/phone within reach   Nurse Communication Mobility status        Time: 9518-8416 OT Time Calculation (min): 31 min  Charges: OT General Charges $OT Visit: 1 Visit OT Treatments $Self Care/Home Management : 23-37 mins  Lenea Bywater MSOT, OTR/L Acute Rehab Pager: [phone] Office: [phone]   Theodoro Grist Cashus Halterman 10/17/2019, 5:23 PM

## 2019-10-18 LAB — GLUCOSE, CAPILLARY
Glucose-Capillary: 148 mg/dL — ABNORMAL HIGH (ref 70–99)
Glucose-Capillary: 138 mg/dL — ABNORMAL HIGH (ref 70–99)
Glucose-Capillary: 143 mg/dL — ABNORMAL HIGH (ref 70–99)

## 2019-10-18 LAB — COMPREHENSIVE METABOLIC PANEL
ALT: 58 U/L — ABNORMAL HIGH (ref 0–44)
AST: 35 U/L (ref 15–41)
Albumin: 2.7 g/dL — ABNORMAL LOW (ref 3.5–5.0)
Alkaline Phosphatase: 55 U/L (ref 38–126)
Anion gap: 9 (ref 5–15)
BUN: 37 mg/dL — ABNORMAL HIGH (ref 8–23)
CO2: 25 mmol/L (ref 22–32)
Calcium: 8.3 mg/dL — ABNORMAL LOW (ref 8.9–10.3)
Chloride: 102 mmol/L (ref 98–111)
Creatinine, Ser: 1.01 mg/dL (ref 0.61–1.24)
GFR calc Af Amer: >60 mL/min (ref >60)
GFR calc non Af Amer: >60 mL/min (ref >60)
Glucose, Bld: 161 mg/dL — ABNORMAL HIGH (ref 70–99)
Potassium: 5.3 mmol/L — ABNORMAL HIGH (ref 3.5–5.1)
Sodium: 136 mmol/L (ref 135–145)
Total Bilirubin: 0.7 mg/dL (ref 0.3–1.2)
Total Protein: 5.5 g/dL — ABNORMAL LOW (ref 6.5–8.1)

## 2019-10-18 LAB — CBC WITH DIFFERENTIAL/PLATELET
Abs Immature Granulocytes: 0.31 10*3/uL — ABNORMAL HIGH (ref 0.00–0.07)
Basophils Absolute: 0 10*3/uL (ref 0.0–0.1)
Basophils Relative: 0 %
Eosinophils Absolute: 0.1 10*3/uL (ref 0.0–0.5)
Eosinophils Relative: 1 %
HCT: 46 % (ref 39.0–52.0)
Hemoglobin: 15.7 g/dL (ref 13.0–17.0)
Immature Granulocytes: 3 %
Lymphocytes Relative: 7 %
Lymphs Abs: 0.8 10*3/uL (ref 0.7–4.0)
MCH: 30.7 pg (ref 26.0–34.0)
MCHC: 34.1 g/dL (ref 30.0–36.0)
MCV: 90 fL (ref 80.0–100.0)
Monocytes Absolute: 0.2 10*3/uL (ref 0.1–1.0)
Monocytes Relative: 1 %
Neutro Abs: 10.4 10*3/uL — ABNORMAL HIGH (ref 1.7–7.7)
Neutrophils Relative %: 88 %
Platelets: 214 10*3/uL (ref 150–400)
RBC: 5.11 MIL/uL (ref 4.22–5.81)
RDW: 13.6 % (ref 11.5–15.5)
WBC: 11.8 10*3/uL — ABNORMAL HIGH (ref 4.0–10.5)
nRBC: 0 % (ref 0.0–0.2)

## 2019-10-18 LAB — MAGNESIUM: Magnesium: 2.3 mg/dL (ref 1.7–2.4)

## 2019-10-18 LAB — C-REACTIVE PROTEIN: CRP: 0.5 mg/dL (ref <1.0)

## 2019-10-18 LAB — BRAIN NATRIURETIC PEPTIDE: B Natriuretic Peptide: 46.7 pg/mL (ref 0.0–100.0)

## 2019-10-18 LAB — D-DIMER, QUANTITATIVE: D-Dimer, Quant: 3.09 ug/mL-FEU — ABNORMAL HIGH (ref 0.00–0.50)

## 2019-10-18 LAB — PROTIME-INR
INR: 3.4 — ABNORMAL HIGH (ref 0.8–1.2)
Prothrombin Time: 34 seconds — ABNORMAL HIGH (ref 11.4–15.2)

## 2019-10-18 MED ORDER — FUROSEMIDE 10 MG/ML IJ SOLN
40.0000 mg | Freq: Once | INTRAMUSCULAR | Status: AC
  Administered 2019-10-18: 40 mg via INTRAVENOUS
  Filled 2019-10-18: qty 4

## 2019-10-18 MED ORDER — WARFARIN SODIUM 5 MG PO TABS: 5.0000 mg | ORAL_TABLET | ORAL | Status: AC

## 2019-10-18 MED ORDER — DEXAMETHASONE SODIUM PHOSPHATE 10 MG/ML IJ SOLN: 4.0000 mg | INTRAMUSCULAR | Status: DC

## 2019-10-18 MED ORDER — SODIUM POLYSTYRENE SULFONATE 15 GM/60ML PO SUSP
30.0000 g | Freq: Once | ORAL | Status: AC
  Administered 2019-10-18: 30 g via ORAL
  Filled 2019-10-18: qty 120

## 2019-10-18 NOTE — Discharge Summary (Signed)
Bruce Stanley XKG:[id number] DOB: 12-14-1945 DOA: 10/11/2019  PCP: Patient, No Pcp Per  Admit date: 10/11/2019  Discharge date: 10/18/2019  Admitted From: Home   Disposition:  Home   Recommendations for Outpatient Follow-up:   Follow up with PCP in 1-2 weeks  PCP Please obtain BMP/CBC, 2 view CXR in 1week,  (see Discharge instructions)   PCP Please follow up on the following pending results: Monitor INR closely   Home Health: PT,RN   Equipment/Devices: 2lit o2  Consultations: None  Discharge Condition: Stable    CODE STATUS: Full    Diet Recommendation: Heart Healthy   Diet Order            Diet - low sodium heart healthy        Diet regular Room service appropriate? Yes; Fluid consistency: Thin  Diet effective now               Chief Complaint  Patient presents with  . Shortness of Breath     Brief history of present illness from the day of admission and additional interim summary    Patient is a 74 y.o. 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  See below for further details.                                                                  Hospital Course   Acute Hypoxic Resp Failure due to Covid 19 Viral pneumonia: He had severe disease with extreme hypoxia, was treated with IV steroids, remdesivir along with Actemra, excellent response, now down to 2.5 L nasal cannula oxygen at rest and is symptom-free.  He has finished his Rx. Will DC home on 3lit [place] o2 with PCP follow up  SpO2: 91 % O2 Flow Rate (L/min): 2.5 L/min  Recent Labs  Lab 10/11/19 1745 10/11/19 1745  10/11/19 1800 10/11/19 1959 10/12/19 0835 10/12/19 0835 10/13/19 0304 10/13/19 0830 10/14/19 0348 10/15/19 0145 10/16/19 0530 10/17/19 0525 10/18/19 0547  CRP 16.5*   < >  --   --  14.8*   < > 6.8*  --  2.4* 1.1* 0.7 <0.5 0.5  DDIMER 0.64*   < >  --   --  0.94*   < > 0.81*  --  3.12* 3.25* 2.28* 2.07* 3.09*  FERRITIN 765*  --   --   --  898*  --  758*  --   --   --   --   --   --   BNP 118.1*  --   --   --   --   --   --    < >  96.5 78.8 72.2 59.7 46.7  PROCALCITON <0.10  --   --   --   --   --   --   --   --   --   --   --   --   SARSCOV2NAA  --   --  POSITIVE* POSITIVE*  --   --   --   --   --   --   --   --   --    < > = values in this interval not displayed.    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 ? NOAC per PCP.  HLD: Continue to hold statins    Discharge diagnosis     Active Problems:   Pneumonia due to COVID-19 virus    Discharge instructions    Discharge Instructions    Diet - low sodium heart healthy   Complete by: As directed    Discharge instructions   Complete by: As directed    Follow with Primary MD  in 2-3 days   Get CBC, CMP, INR, 2 view Chest X ray -  checked next visit within 2-3 days by Primary MD    Activity: As tolerated with Full fall precautions use walker/cane & assistance as needed  Disposition Home    Diet: Heart Healthy     Special Instructions: If you have smoked or chewed Tobacco  in the last 2 yrs please stop smoking, stop any regular Alcohol  and or any Recreational drug use.  On your next visit with your primary  care physician please Get Medicines reviewed and adjusted.  Please request your Prim.MD to go over all Hospital Tests and Procedure/Radiological results at the follow up, please get all Hospital records sent to your Prim MD by signing hospital release before you go home.  If you experience worsening of your admission symptoms, develop shortness of breath, life threatening emergency, suicidal or homicidal thoughts you must seek medical attention immediately by calling 911 or calling your MD immediately  if symptoms less severe.  You Must read complete instructions/literature along with all the possible adverse reactions/side effects for all the Medicines you take and that have been prescribed to you. Take any new Medicines after you have completely understood and accpet all the possible adverse reactions/side effects.   Increase activity slowly   Complete by: As directed    MyChart COVID-19 home monitoring program   Complete by: Oct 18, 2019    Is the patient willing to use the [place] for home monitoring?: Yes   Temperature monitoring   Complete by: Oct 18, 2019    After how many days would you like to receive a notification of this patient's flowsheet entries?: 1      Discharge Medications   Allergies as of 10/18/2019      Reactions   Iodine Itching      Medication List    STOP taking these medications   benzonatate 200 MG capsule Commonly known as: TESSALON  doxycycline 100 MG tablet Commonly known as: VIBRA-TABS   guaiFENesin-codeine 100-10 MG/5ML syrup Commonly known as: ROBITUSSIN AC   predniSONE 5 MG tablet Commonly known as: DELTASONE     TAKE these medications   acetaminophen 500 MG tablet Commonly known as: TYLENOL Take 1,000 mg by mouth every 6 (six) hours as needed for mild pain or moderate pain.   albuterol 108 (90 Base) MCG/ACT inhaler Commonly known as: VENTOLIN HFA Inhale 2 puffs into the lungs every 4 (four) hours as needed for wheezing or  shortness of breath.   atorvastatin 40 MG tablet Commonly known as: LIPITOR Take 40 mg by mouth daily at 6 PM.   Fish Oil 1000 MG Caps Take 1,000 mg by mouth daily.   fluticasone 50 MCG/ACT nasal spray Commonly known as: FLONASE Place 2 sprays into both nostrils 2 (two) times daily.   warfarin 5 MG tablet Commonly known as: COUMADIN Take 1 tablet (5 mg total) by mouth See admin instructions. Get INR checked at PCP office along with your CMP in 2-3 days. Start taking on: October 20, 2019 What changed:   additional instructions  These instructions start on October 20, 2019. If you are unsure what to do until then, ask your doctor or other care provider.            Durable Medical Equipment  (From admission, onward)         Start     Ordered   10/17/19 1015  For home use only DME oxygen  Once    Question Answer Comment  Length of Need 6 Months   Mode or (Route) Nasal cannula   Liters per Minute 3   Frequency Continuous (stationary and portable oxygen unit needed)   Oxygen conserving device Yes   Oxygen delivery system Gas      10/17/19 1014          Follow-up Information    PCP. Schedule an appointment as soon as possible for a visit in 1 week(s).           Major procedures and Radiology Reports - PLEASE review detailed and final reports thoroughly  -       DG Chest Port 1 View  Result Date: 10/11/2019 CLINICAL DATA:  Shortness of breath EXAM: PORTABLE CHEST 1 VIEW COMPARISON:  10/07/2019 FINDINGS: Interim development of bilateral left greater than right interstitial and ground-glass opacity, largely peripheral on the left. No pleural effusion. Mild cardiomegaly. No pneumothorax. IMPRESSION: Interim development of left greater than right interstitial and ground-glass opacity, suspicious for bilateral pneumonia, possible atypical or viral pneumonia. Electronically Signed   By: Donavan Foil M.D.   On: 10/11/2019 18:22    Micro Results     Recent Results  (from the past 240 hour(s))  Blood Culture (routine x 2)     Status: None   Collection Time: 10/11/19  5:45 PM   Specimen: BLOOD RIGHT ARM  Result Value Ref Range Status   Specimen Description   Final    BLOOD RIGHT ARM Performed at [facility], [address] [place], [place] [address]    Special Requests   Final    BOTTLES DRAWN AEROBIC AND ANAEROBIC Blood Culture adequate volume Performed at [facility], [address] [place], [place] [address]    Culture   Final    NO GROWTH 5 DAYS Performed at [facility], [place] [address] [place], [place] [address]    Report  Status 10/16/2019 FINAL  Final  SARS Coronavirus 2 Ag (30 min TAT) - Nasal Swab (BD Veritor Kit)     Status: None   Collection Time: 10/11/19  5:45 PM   Specimen: Nasal Swab (BD Veritor Kit)  Result Value Ref Range Status   SARS Coronavirus 2 Ag NEGATIVE NEGATIVE Final    Comment: (NOTE) SARS-CoV-2 antigen NOT DETECTED.  Negative results are presumptive.  Negative results do not preclude SARS-CoV-2 infection and should not be used as the sole basis for treatment or other patient management decisions, including infection  control decisions, particularly in the presence of clinical signs and  symptoms consistent with COVID-19, or in those who have been in contact with the virus.  Negative results must be combined with clinical observations, patient history, and epidemiological information. The expected result is Negative. Fact Sheet for Patients: PodPark.tn Fact Sheet for Healthcare Providers: GiftContent.is This test is not yet approved or cleared by the [place] FDA and  has been authorized for detection and/or diagnosis of SARS-CoV-2 by FDA under an Emergency Use Authorization (EUA).  This EUA will remain in effect (meaning this test can be used) for the duration of  the COVID-19 de claration under Section 564(b)(1) of  the Act, 21 U.S.C. section 360bbb-3(b)(1), unless the authorization is terminated or revoked sooner. Performed at [facility], [address] [place], [place] [address]   Blood Culture (routine x 2)     Status: None   Collection Time: 10/11/19  5:53 PM   Specimen: BLOOD LEFT ARM  Result Value Ref Range Status   Specimen Description   Final    BLOOD LEFT ARM Performed at [facility], [address] [place], [place] [address]    Special Requests   Final    BOTTLES DRAWN AEROBIC AND ANAEROBIC Blood Culture adequate volume Performed at [facility], [address] [place], [place] [address]    Culture   Final    NO GROWTH 5 DAYS Performed at [facility], [place] [address], [place], [place] [address]    Report Status 10/16/2019 FINAL  Final  Respiratory Panel by RT PCR (Flu A&B, Covid) - Nasopharyngeal Swab     Status: Abnormal   Collection Time: 10/11/19  6:00 PM   Specimen: Nasopharyngeal Swab  Result Value Ref Range Status   SARS Coronavirus 2 by RT PCR POSITIVE (A) NEGATIVE Final    Comment: RESULT CALLED TO, READ BACK BY AND VERIFIED WITH: K NEAL RN 10/11/19 22 01 JDW (NOTE) SARS-CoV-2 target nucleic acids are DETECTED. SARS-CoV-2 RNA is generally detectable in upper respiratory specimens  during the acute phase of infection. Positive results are indicative of the presence of the identified virus, but do not rule out bacterial infection or co-infection with other pathogens not detected by the test. Clinical correlation with patient history and other diagnostic information is necessary to determine patient infection status. The expected result is Negative. Fact Sheet for Patients:  PinkCheek.be Fact Sheet for Healthcare Providers: GravelBags.it This test is not yet approved or cleared by the [place] FDA and  has been authorized for detection and/or diagnosis of SARS-CoV-2  by FDA under an Emergency Use Authorization (EUA).  This EUA will remain in effect (meaning this test can be used) for the d uration of  the COVID-19 declaration under Section 564(b)(1) of the Act, 21 U.S.C. section 360bbb-3(b)(1), unless the authorization is terminated or revoked sooner.    Influenza  A by PCR NEGATIVE NEGATIVE Final   Influenza B by PCR NEGATIVE NEGATIVE Final    Comment: (NOTE) The Xpert Xpress SARS-CoV-2/FLU/RSV assay is intended as an aid in  the diagnosis of influenza from Nasopharyngeal swab specimens and  should not be used as a sole basis for treatment. Nasal washings and  aspirates are unacceptable for Xpert Xpress SARS-CoV-2/FLU/RSV  testing. Fact Sheet for Patients: PinkCheek.be Fact Sheet for Healthcare Providers: GravelBags.it This test is not yet approved or cleared by the [place] FDA and  has been authorized for detection and/or diagnosis of SARS-CoV-2 by  FDA under an Emergency Use Authorization (EUA). This EUA will remain  in effect (meaning this test can be used) for the duration of the  Covid-19 declaration under Section 564(b)(1) of the Act, 21  U.S.C. section 360bbb-3(b)(1), unless the authorization is  terminated or revoked. Performed at [facility], [place] [address], [place], [place] [address]   SARS Coronavirus 2 by RT PCR (hospital order, performed in [facility] hospital lab) Nasopharyngeal Nasopharyngeal Swab     Status: Abnormal   Collection Time: 10/11/19  7:59 PM   Specimen: Nasopharyngeal Swab  Result Value Ref Range Status   SARS Coronavirus 2 POSITIVE (A) NEGATIVE Final    Comment: RESULT CALLED TO, READ BACK BY AND VERIFIED WITHJosph Macho RN [phone] PHILLIPS C Performed at [facility], [address] [place], [place] [address]     Today   Subjective    Trevaris Pennella today has no headache,no chest abdominal pain,no new weakness tingling or  numbness, feels much better wants to go home today.    Objective   Blood pressure 101/73, pulse 85, temperature (!) 97.5 F (36.4 C), temperature source Oral, resp. rate 18, height 6' (1.829 m), weight 117.9 kg, SpO2 91 %.   Intake/Output Summary (Last 24 hours) at 10/18/2019 0750 Last data filed at 10/18/2019 0413 Gross per 24 hour  Intake 300 ml  Output 1325 ml  Net -1025 ml    Exam Awake Alert,  No new F.N deficits, Normal affect [place].AT,PERRAL Supple Neck,No JVD, No cervical lymphadenopathy appriciated.  Symmetrical Chest wall movement, Good air movement bilaterally, CTAB RRR,No Gallops,Rubs or new Murmurs, No Parasternal Heave +ve B.Sounds, Abd Soft, Non tender, No organomegaly appriciated, No rebound -guarding or rigidity. No Cyanosis, Clubbing or edema, No new Rash or bruise   Data Review   CBC w Diff:  Lab Results  Component Value Date   WBC 11.8 (H) 10/18/2019   HGB 15.7 10/18/2019   HCT 46.0 10/18/2019   PLT 214 10/18/2019   LYMPHOPCT 7 10/18/2019   MONOPCT 1 10/18/2019   EOSPCT 1 10/18/2019   BASOPCT 0 10/18/2019    CMP:  Lab Results  Component Value Date   NA 136 10/18/2019   K 5.3 (H) 10/18/2019   CL 102 10/18/2019   CO2 25 10/18/2019   BUN 37 (H) 10/18/2019   CREATININE 1.01 10/18/2019   PROT 5.5 (L) 10/18/2019   ALBUMIN 2.7 (L) 10/18/2019   BILITOT 0.7 10/18/2019   ALKPHOS 55 10/18/2019   AST 35 10/18/2019   ALT 58 (H) 10/18/2019  . Lab Results  Component Value Date   INR 3.4 (H) 10/18/2019   INR 3.6 (H) 10/17/2019   INR 3.0 (H) 10/16/2019     Total Time in preparing paper work, data evaluation and todays exam - 68 minutes  Lala Lund M.D on 10/18/2019 at 7:50 AM  [facility]  Office  [phone]

## 2019-10-18 NOTE — Discharge Instructions (Signed)
Follow with Primary MD  in 2-3 days   Get CBC, CMP, INR, 2 view Chest X ray -  checked next visit within 2-3 days by Primary MD    Activity: As tolerated with Full fall precautions use walker/cane & assistance as needed  Disposition Home    Diet: Heart Healthy     Special Instructions: If you have smoked or chewed Tobacco  in the last 2 yrs please stop smoking, stop any regular Alcohol  and or any Recreational drug use.  On your next visit with your primary care physician please Get Medicines reviewed and adjusted.  Please request your Prim.MD to go over all Hospital Tests and Procedure/Radiological results at the follow up, please get all Hospital records sent to your Prim MD by signing hospital release before you go home.  If you experience worsening of your admission symptoms, develop shortness of breath, life threatening emergency, suicidal or homicidal thoughts you must seek medical attention immediately by calling 911 or calling your MD immediately  if symptoms less severe.  You Must read complete instructions/literature along with all the possible adverse reactions/side effects for all the Medicines you take and that have been prescribed to you. Take any new Medicines after you have completely understood and accpet all the possible adverse reactions/side effects.       Person Under Monitoring Name: Bruce Stanley  Location: [place] [address]   Infection Prevention Recommendations for Individuals Confirmed to have, or Being Evaluated for, 2019 Novel Coronavirus (COVID-19) Infection Who Receive Care at Home  Individuals who are confirmed to have, or are being evaluated for, COVID-19 should follow the prevention steps below until a healthcare provider or local or state health department says they can return to normal activities.  Stay home except to get medical care You should restrict activities outside your home, except for getting medical care. Do not go to  work, school, or public areas, and do not use public transportation or taxis.  Call ahead before visiting your doctor Before your medical appointment, call the healthcare provider and tell them that you have, or are being evaluated for, COVID-19 infection. This will help the healthcare provider's office take steps to keep other people from getting infected. Ask your healthcare provider to call the local or state health department.  Monitor your symptoms Seek prompt medical attention if your illness is worsening (e.g., difficulty breathing). Before going to your medical appointment, call the healthcare provider and tell them that you have, or are being evaluated for, COVID-19 infection. Ask your healthcare provider to call the local or state health department.  Wear a facemask You should wear a facemask that covers your nose and mouth when you are in the same room with other people and when you visit a healthcare provider. People who live with or visit you should also wear a facemask while they are in the same room with you.  Separate yourself from other people in your home As much as possible, you should stay in a different room from other people in your home. Also, you should use a separate bathroom, if available.  Avoid sharing household items You should not share dishes, drinking glasses, cups, eating utensils, towels, bedding, or other items with other people in your home. After using these items, you should wash them thoroughly with soap and water.  Cover your coughs and sneezes Cover your mouth and nose with a tissue when you cough or sneeze, or you can cough or  sneeze into your sleeve. Throw used tissues in a lined trash can, and immediately wash your hands with soap and water for at least 20 seconds or use an alcohol-based hand rub.  Wash your [employer] your hands often and thoroughly with soap and water for at least 20 seconds. You can use an alcohol-based hand sanitizer if  soap and water are not available and if your hands are not visibly dirty. Avoid touching your eyes, nose, and mouth with unwashed hands.   Prevention Steps for Caregivers and Household Members of Individuals Confirmed to have, or Being Evaluated for, COVID-19 Infection Being Cared for in the Home  If you live with, or provide care at home for, a person confirmed to have, or being evaluated for, COVID-19 infection please follow these guidelines to prevent infection:  Follow healthcare provider's instructions Make sure that you understand and can help the patient follow any healthcare provider instructions for all care.  Provide for the patient's basic needs You should help the patient with basic needs in the home and provide support for getting groceries, prescriptions, and other personal needs.  Monitor the patient's symptoms If they are getting sicker, call his or her medical provider and tell them that the patient has, or is being evaluated for, COVID-19 infection. This will help the healthcare provider's office take steps to keep other people from getting infected. Ask the healthcare provider to call the local or state health department.  Limit the number of people who have contact with the patient  If possible, have only one caregiver for the patient.  Other household members should stay in another home or place of residence. If this is not possible, they should stay  in another room, or be separated from the patient as much as possible. Use a separate bathroom, if available.  Restrict visitors who do not have an essential need to be in the home.  Keep older adults, very young children, and other sick people away from the patient Keep older adults, very young children, and those who have compromised immune systems or chronic health conditions away from the patient. This includes people with chronic heart, lung, or kidney conditions, diabetes, and cancer.  Ensure good  ventilation Make sure that shared spaces in the home have good air flow, such as from an air conditioner or an opened window, weather permitting.  Wash your hands often  Wash your hands often and thoroughly with soap and water for at least 20 seconds. You can use an alcohol based hand sanitizer if soap and water are not available and if your hands are not visibly dirty.  Avoid touching your eyes, nose, and mouth with unwashed hands.  Use disposable paper towels to dry your hands. If not available, use dedicated cloth towels and replace them when they become wet.  Wear a facemask and gloves  Wear a disposable facemask at all times in the room and gloves when you touch or have contact with the patient's blood, body fluids, and/or secretions or excretions, such as sweat, saliva, sputum, nasal mucus, vomit, urine, or feces.  Ensure the mask fits over your nose and mouth tightly, and do not touch it during use.  Throw out disposable facemasks and gloves after using them. Do not reuse.  Wash your hands immediately after removing your facemask and gloves.  If your personal clothing becomes contaminated, carefully remove clothing and launder. Wash your hands after handling contaminated clothing.  Place all used disposable facemasks, gloves, and other waste  in a lined container before disposing them with other household waste.  Remove gloves and wash your hands immediately after handling these items.  Do not share dishes, glasses, or other household items with the patient  Avoid sharing household items. You should not share dishes, drinking glasses, cups, eating utensils, towels, bedding, or other items with a patient who is confirmed to have, or being evaluated for, COVID-19 infection.  After the person uses these items, you should wash them thoroughly with soap and water.  Wash laundry thoroughly  Immediately remove and wash clothes or bedding that have blood, body fluids, and/or  secretions or excretions, such as sweat, saliva, sputum, nasal mucus, vomit, urine, or feces, on them.  Wear gloves when handling laundry from the patient.  Read and follow directions on labels of laundry or clothing items and detergent. In general, wash and dry with the warmest temperatures recommended on the label.  Clean all areas the individual has used often  Clean all touchable surfaces, such as counters, tabletops, doorknobs, bathroom fixtures, toilets, phones, keyboards, tablets, and bedside tables, every day. Also, clean any surfaces that may have blood, body fluids, and/or secretions or excretions on them.  Wear gloves when cleaning surfaces the patient has come in contact with.  Use a diluted bleach solution (e.g., dilute bleach with 1 part bleach and 10 parts water) or a household disinfectant with a label that says EPA-registered for coronaviruses. To make a bleach solution at home, add 1 tablespoon of bleach to 1 quart (4 cups) of water. For a larger supply, add  cup of bleach to 1 gallon (16 cups) of water.  Read labels of cleaning products and follow recommendations provided on product labels. Labels contain instructions for safe and effective use of the cleaning product including precautions you should take when applying the product, such as wearing gloves or eye protection and making sure you have good ventilation during use of the product.  Remove gloves and wash hands immediately after cleaning.  Monitor yourself for signs and symptoms of illness Caregivers and household members are considered close contacts, should monitor their health, and will be asked to limit movement outside of the home to the extent possible. Follow the monitoring steps for close contacts listed on the symptom monitoring form.   ? If you have additional questions, contact your local health department or call the epidemiologist on call at [phone] (available 24/7). ? This guidance is subject  to change. For the most up-to-date guidance from [facility], please refer to their website: [email or web address]

## 2019-10-18 NOTE — Progress Notes (Signed)
ANTICOAGULATION CONSULT NOTE - Follow-Up  Pharmacy Consult for warfarin dosing Indication:  Hx of DVT/PE, lifelong anti-coagulation  Allergies  Allergen Reactions  . Iodine Itching    Patient Measurements: Height: 6' (182.9 cm) Weight: 260 lb (117.9 kg) IBW/kg (Calculated) : 77.6 Heparin Dosing Weight: HEPARIN DW (KG): 103.3  Vital Signs: Temp: 97.5 F (36.4 C) (01/22 0400) Temp Source: Oral (01/22 0400) BP: 119/87 (01/22 0902) Pulse Rate: 95 (01/22 0902)  Labs: Recent Labs    10/16/19 0530 10/16/19 0530 10/17/19 0525 10/18/19 0547  HGB 15.5   < > 14.7 15.7  HCT 46.6  --  43.8 46.0  PLT 302  --  242 214  LABPROT 31.1*  --  36.0* 34.0*  INR 3.0*  --  3.6* 3.4*  CREATININE 0.98  --  1.01 1.01   < > = values in this interval not displayed.    Estimated Creatinine Clearance: 85 mL/min (by C-G formula based on SCr of 1.01 mg/dL).  Assessment: Pharmacy consulted to dose warfarin for this 74 yo male on chronic anti-coagulation for history of DVT/PE.  INR supra-therapeutic at last Coumadin clinic 2/2 doxy, pred, dec vit K intake from illness.     Home dose: warfarin 5mg  daily  INR is still supratherapeutic. Pt is being discharged today and coumadin is to be restarted on 1/24. Should be appropriated by then. INR to be checked at PCP.   Goal of Therapy:  INR 2-3 Monitor platelets by anticoagulation protocol: Yes   Plan:  No warfarin tonight Daily PT/INR   2/24, PharmD, BCIDP, AAHIVP, CPP Infectious Disease Pharmacist 10/18/2019 1:37 PM

## 2019-10-18 NOTE — Progress Notes (Signed)
SATURATION QUALIFICATIONS: (This note is used to comply with regulatory documentation for home oxygen)  Patient Saturations on Room Air at Rest = 91%  Patient Saturations on Room Air while Ambulating = 84%  Patient Saturations on 3 Liters of oxygen while Ambulating = 85-90%  Please briefly explain why patient needs home oxygen: Pt needs supplemental oxygen to maintain saturations at 88% and above while ambulating.

## 2019-10-18 NOTE — TOC Transition Note (Signed)
Transition of Care [facility]) - CM/SW Discharge Note   Patient Details  Name: Bruce Stanley MRN: [id number] Date of Birth: 12/29/1945  [facility] CM/SW Contact:  Truddie Hidden, LCSW Phone Number: 10/18/2019, 2:30 PM   Clinical Narrative:    Discharged home with home health services. Portable oxygen tank delivered to room by [facility] RN. Patient aware and agreeable to this plan.   Concentrator will be delivered to patient's home. Unit RN is aware to NOT discharge patient until wife has verified delivery of concentrator.   No other needs at this time. Case closed to this CSW.    Final next level of care: Home w Home Health Services     Patient Goals and CMS Choice Patient states their goals for this hospitalization and ongoing recovery are:: return home with my wife CMS Medicare.gov Compare Post Acute Care list provided to:: Patient Choice offered to / list presented to : Patient  Discharge Placement                       Discharge Plan and Services                DME Arranged: Oxygen DME Agency: Patsy Lager Date DME Agency Contacted: 10/18/19 Time DME Agency Contacted: 1252 Representative spoke with at DME Agency: Morrie Sheldon HH Arranged: RN HH Agency: [facility] Date HH Agency Contacted: 10/18/19 Time HH Agency Contacted: [phone] Representative spoke with at [facility] Agency: Tiffany  Social Determinants of Health (SDOH) Interventions     Readmission Risk Interventions No flowsheet data found.

## 2019-10-18 NOTE — Progress Notes (Signed)
Ambulation Note  Saturation Pre: 91% on RA  Ambulation Distance: 230 ft  Saturation During Ambulation: 85-90% on 3L  Notes: Pt walked independently. Tolerated well. As soon as pt stood up on RA, sats dropped to 84%. Increased to 3L. Pt returned to recliner with call bell within reach. Sats were 91% on 3L.   Alisia Ferrari, MS, ACSM CEP 10:48 AM 10/18/2019

## 2021-11-04 ENCOUNTER — Other Ambulatory Visit: Payer: Self-pay | Admitting: Urology

## 2021-11-04 DIAGNOSIS — Z125 Encounter for screening for malignant neoplasm of prostate: Secondary | ICD-10-CM

## 2021-11-04 DIAGNOSIS — R972 Elevated prostate specific antigen [PSA]: Secondary | ICD-10-CM

## 2021-12-06 ENCOUNTER — Ambulatory Visit
Admission: RE | Admit: 2021-12-06 | Discharge: 2021-12-06 | Disposition: A | Discharge status: Home / Self Care | Payer: Medicare Other | Source: Ambulatory Visit | Attending: Urology | Admitting: Urology

## 2021-12-06 ENCOUNTER — Other Ambulatory Visit: Payer: Self-pay | Admitting: Home Modifications

## 2021-12-06 ENCOUNTER — Other Ambulatory Visit: Payer: Self-pay

## 2021-12-06 ENCOUNTER — Other Ambulatory Visit: Payer: Self-pay | Admitting: Family Medicine

## 2021-12-06 DIAGNOSIS — R972 Elevated prostate specific antigen [PSA]: Secondary | ICD-10-CM

## 2021-12-06 DIAGNOSIS — Z125 Encounter for screening for malignant neoplasm of prostate: Secondary | ICD-10-CM

## 2021-12-06 MED ORDER — GADOBENATE DIMEGLUMINE 529 MG/ML IV SOLN
20.0000 mL | Freq: Once | INTRAVENOUS | Status: AC | PRN
  Administered 2021-12-06: 20 mL via INTRAVENOUS

## 2022-08-20 IMAGING — MR MR PROSTATE WO/W CM
12 series · 48 of 48 positions shown · IV contrast (20 ML MULTIHANCE)
Comparison: None.

CLINICAL DATA: Elevated PSA level 5.52 on 10/21/2021

EXAM:
MR PROSTATE WITHOUT AND WITH CONTRAST
TECHNIQUE: Multiplanar multisequence MRI images were obtained of the pelvis
centered about the prostate. Pre and post contrast images were
obtained.
CONTRAST:  20mL MULTIHANCE GADOBENATE DIMEGLUMINE 529 MG/ML IV SOLN

[Series 4: T2 · coronal · 3.0mm · 0.56mm/px · 1 of 26 slices shown (1 of 3)]
[im 1/26]
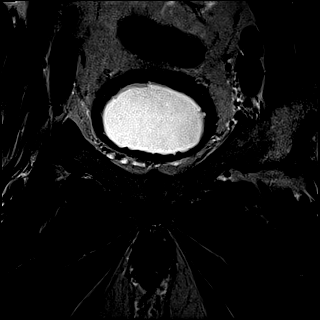

[Series 5: T1 · axial · 5.0mm · 1.25mm/px · 1 of 80 slices shown]
[im 1/80]
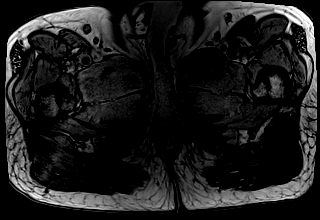

[Series 6: DWI · axial · 3.0mm · 1.75mm/px · 1 of 78 slices shown (1 of 3)]
[im 1/78]
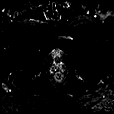

[Series 7: DWI · axial · 3.0mm · 1.75mm/px · 1 of 26 slices shown (2 of 3)]
[im 1/26]
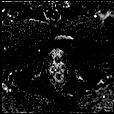

[Series 8: DWI · axial · 3.0mm · 1.75mm/px · 1 of 26 slices shown (3 of 3)]
[im 1/26]
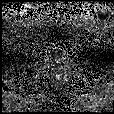

[Series 9: T2 · axial · 3.0mm · 0.56mm/px · 1 of 26 slices shown (2 of 3)]
[im 1/26]
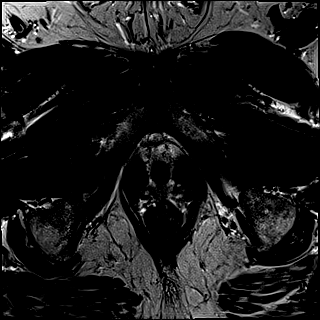

[Series 10: T2 · axial · 1.0mm · 1.04mm/px · z∈[-25,+54]mm · 2 of 80 slices shown (3 of 3)]
[im 1/80]
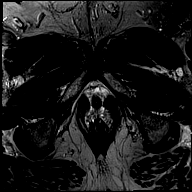
[im 80/80]
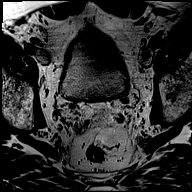

[Series 11: pre t1_twist_tra_dyn · axial · non-contrast · 3.5mm · 0.83mm/px · 1 of 24 slices shown]
[im 1/24]
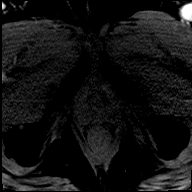

[Series 12: post t1_twist_tra_dyn-copy center · axial · non-contrast · 3.5mm · 0.83mm/px · z∈[-26,+55]mm · 18 of 720 slices shown]
[im 1/720]
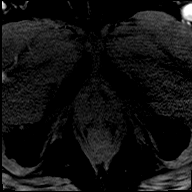
[im 43/720]
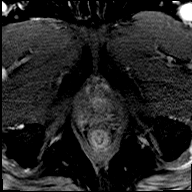
[im 85/720]
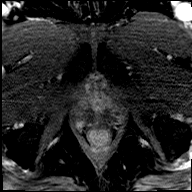
[im 127/720]
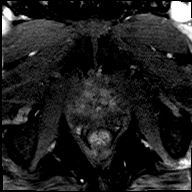
[im 170/720]
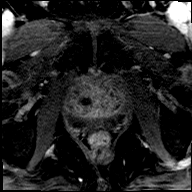
[im 212/720]
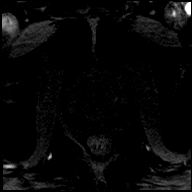
[im 254/720]
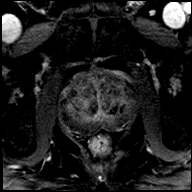
[im 297/720]
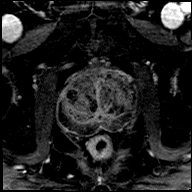
[im 339/720]
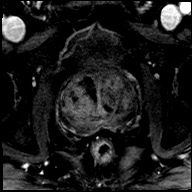
[im 381/720]
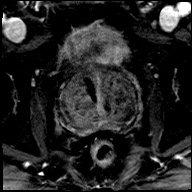
[im 423/720]
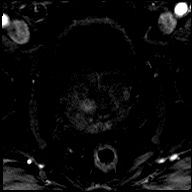
[im 466/720]
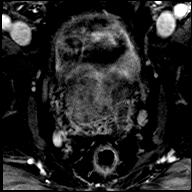
[im 508/720]
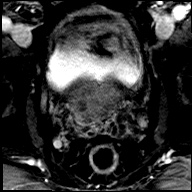
[im 550/720]
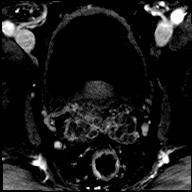
[im 593/720]
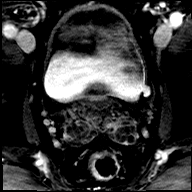
[im 635/720]
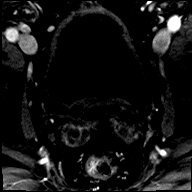
[im 677/720]
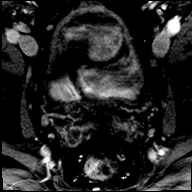
[im 720/720]
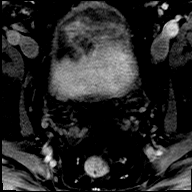

[Series 13: post t1_twist_tra_dyn-copy cent_sub · axial · 3.5mm · 0.83mm/px · z∈[-26,+55]mm · 17 of 693 slices shown]
[im 1/693]
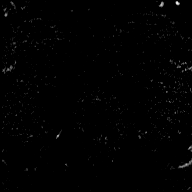
[im 44/693]
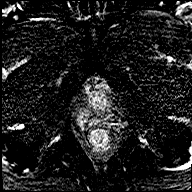
[im 87/693]
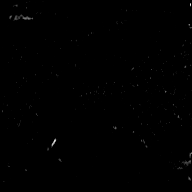
[im 130/693]
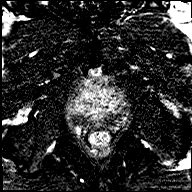
[im 174/693]
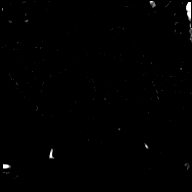
[im 217/693]
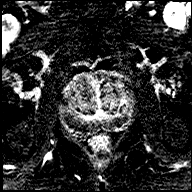
[im 260/693]
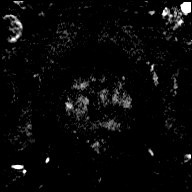
[im 303/693]
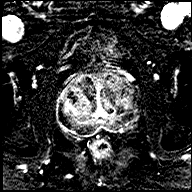
[im 347/693]
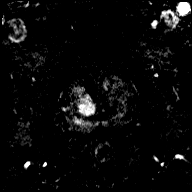
[im 390/693]
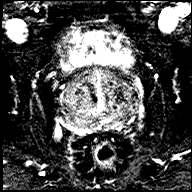
[im 433/693]
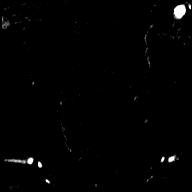
[im 476/693]
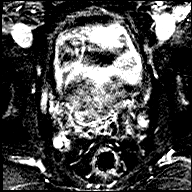
[im 520/693]
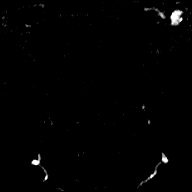
[im 563/693]
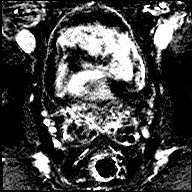
[im 606/693]
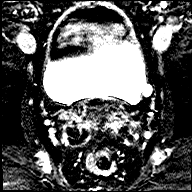
[im 649/693]
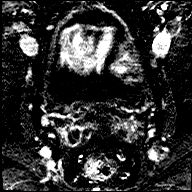
[im 693/693]
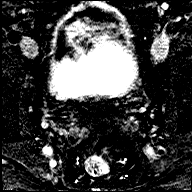

[Series 14: t1_vibe_dixon_tra_f · axial · 2.5mm · 0.91mm/px · z∈[-42,+156]mm · 2 of 80 slices shown]
[im 1/80]
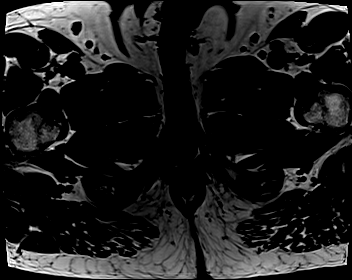
[im 80/80]
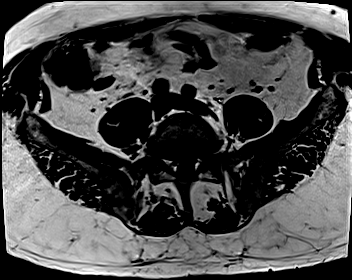

[Series 15: t1_vibe_dixon_tra_w · axial · 2.5mm · 0.91mm/px · z∈[-42,+156]mm · 2 of 80 slices shown]
[im 1/80]
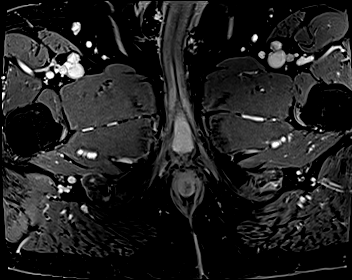
[im 80/80]
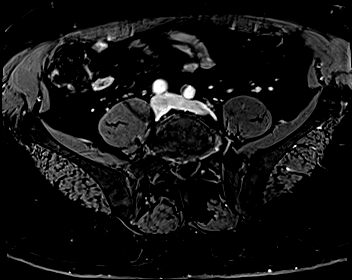

[48 of 48 positions shown; findings below may reference images not displayed]

FINDINGS: Prostate:

Encapsulated nodularity in the transition zone compatible with
benign prostatic hypertrophy.

Region of interest # 1: Small PI-RADS category 3 lesion of the left
posterolateral peripheral zone in the upper apex with reduced T2
signal (image 18, series 9) but no early enhancement or restricted
diffusion. This measures 0.21 cc (1.2 by 0.4 by 0.5 cm)

Volume: 3D volumetric analysis: Prostate volume 109.98 cc (6.8 by
5.7 by 6.2 cm).

Transcapsular spread:  Absent

Seminal vesicle involvement: Absent

Neurovascular bundle involvement: Absent

Pelvic adenopathy: Absent

Bone metastasis: Absent

Other findings: Small left eccentric diverticulum of the urinary
bladder, image 13 series 10.
IMPRESSION: 1. Small PI-RADS category 3 lesion of the left peripheral zone.
Targeting data sent to [facility].
2. Considerable prostatomegaly and benign prostatic hypertrophy.
